# Patient Record
Sex: Female | Born: 1970 | Race: White | Hispanic: No | Marital: Single | Smoking: Former smoker
Health system: Southern US, Community
[De-identification: ages and names within clinical notes are randomized; demographics above are authoritative.]

## PROBLEM LIST (undated history)

## (undated) DIAGNOSIS — E785 Hyperlipidemia, unspecified: Secondary | ICD-10-CM

## (undated) DIAGNOSIS — F330 Major depressive disorder, recurrent, mild: Secondary | ICD-10-CM

## (undated) DIAGNOSIS — G500 Trigeminal neuralgia: Secondary | ICD-10-CM

## (undated) DIAGNOSIS — E559 Vitamin D deficiency, unspecified: Secondary | ICD-10-CM

## (undated) DIAGNOSIS — M419 Scoliosis, unspecified: Secondary | ICD-10-CM

## (undated) DIAGNOSIS — N182 Chronic kidney disease, stage 2 (mild): Secondary | ICD-10-CM

## (undated) DIAGNOSIS — J309 Allergic rhinitis, unspecified: Secondary | ICD-10-CM

## (undated) DIAGNOSIS — F909 Attention-deficit hyperactivity disorder, unspecified type: Secondary | ICD-10-CM

## (undated) HISTORY — DX: Trigeminal neuralgia: G50.0

## (undated) HISTORY — DX: Scoliosis, unspecified: M41.9

## (undated) HISTORY — DX: Attention-deficit hyperactivity disorder, unspecified type: F90.9

## (undated) HISTORY — DX: Major depressive disorder, recurrent, mild: F33.0

## (undated) HISTORY — DX: Chronic kidney disease, stage 2 (mild): N18.2

## (undated) HISTORY — DX: Allergic rhinitis, unspecified: J30.9

## (undated) HISTORY — DX: Hyperlipidemia, unspecified: E78.5

## (undated) HISTORY — DX: Vitamin D deficiency, unspecified: E55.9

---

## 2009-03-01 ENCOUNTER — Encounter: Admission: RE | Admit: 2009-03-01 | Discharge: 2009-03-01 | Payer: Self-pay | Admitting: *Deleted

## 2009-03-01 ENCOUNTER — Ambulatory Visit (HOSPITAL_COMMUNITY): Admission: RE | Admit: 2009-03-01 | Discharge: 2009-03-01 | Payer: Self-pay | Admitting: *Deleted

## 2009-03-17 ENCOUNTER — Ambulatory Visit (HOSPITAL_COMMUNITY): Admission: RE | Admit: 2009-03-17 | Discharge: 2009-03-17 | Payer: Self-pay | Admitting: *Deleted

## 2009-04-27 ENCOUNTER — Encounter: Admission: RE | Admit: 2009-04-27 | Discharge: 2009-07-26 | Payer: Self-pay | Admitting: Surgery

## 2009-05-23 ENCOUNTER — Ambulatory Visit (HOSPITAL_COMMUNITY): Admission: RE | Admit: 2009-05-23 | Discharge: 2009-05-24 | Payer: Self-pay | Admitting: Surgery

## 2009-08-21 ENCOUNTER — Encounter: Admission: RE | Admit: 2009-08-21 | Discharge: 2009-08-21 | Payer: Self-pay | Admitting: Surgery

## 2010-04-05 ENCOUNTER — Encounter: Admission: RE | Admit: 2010-04-05 | Discharge: 2010-04-05 | Payer: Self-pay | Admitting: Surgery

## 2011-04-09 LAB — DIFFERENTIAL
Basophils Absolute: 0 10*3/uL (ref 0.0–0.1)
Basophils Absolute: 0 10*3/uL (ref 0.0–0.1)
Basophils Relative: 0 % (ref 0–1)
Basophils Relative: 0 % (ref 0–1)
Eosinophils Absolute: 0.1 10*3/uL (ref 0.0–0.7)
Eosinophils Absolute: 0.1 10*3/uL (ref 0.0–0.7)
Eosinophils Relative: 2 % (ref 0–5)
Lymphs Abs: 1.4 10*3/uL (ref 0.7–4.0)
Monocytes Absolute: 0.4 10*3/uL (ref 0.1–1.0)
Monocytes Relative: 5 % (ref 3–12)
Neutrophils Relative %: 76 % (ref 43–77)

## 2011-04-09 LAB — COMPREHENSIVE METABOLIC PANEL
ALT: 76 U/L — ABNORMAL HIGH (ref 0–35)
CO2: 29 mEq/L (ref 19–32)
Calcium: 9.6 mg/dL (ref 8.4–10.5)
Creatinine, Ser: 0.82 mg/dL (ref 0.4–1.2)
GFR calc non Af Amer: >60 mL/min (ref >60)
Glucose, Bld: 101 mg/dL — ABNORMAL HIGH (ref 70–99)
Sodium: 138 mEq/L (ref 135–145)
Total Bilirubin: 0.7 mg/dL (ref 0.3–1.2)

## 2011-04-09 LAB — CBC
HCT: 39.1 % (ref 36.0–46.0)
Hemoglobin: 13.3 g/dL (ref 12.0–15.0)
Hemoglobin: 15.6 g/dL — ABNORMAL HIGH (ref 12.0–15.0)
MCHC: 33.8 g/dL (ref 30.0–36.0)
MCHC: 34.1 g/dL (ref 30.0–36.0)
MCV: 85.2 fL (ref 78.0–100.0)
MCV: 85.7 fL (ref 78.0–100.0)
RBC: 5.4 MIL/uL — ABNORMAL HIGH (ref 3.87–5.11)
RDW: 12.6 % (ref 11.5–15.5)

## 2011-04-09 LAB — GLUCOSE, CAPILLARY: Glucose-Capillary: 100 mg/dL — ABNORMAL HIGH (ref 70–99)

## 2011-04-09 LAB — HEMOGLOBIN AND HEMATOCRIT, BLOOD: Hemoglobin: 13.2 g/dL (ref 12.0–15.0)

## 2011-04-09 LAB — PREGNANCY, URINE: Preg Test, Ur: NEGATIVE

## 2011-05-14 NOTE — Op Note (Signed)
NAMEDORIANA, MAZURKIEWICZ            ACCOUNT NO.:  [id number]   MEDICAL RECORD NO.:  [id number]          PATIENT TYPE:  OIB   LOCATION:  1538                         FACILITY:  [facility]   PHYSICIAN:  Thornton Park. 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  Upper GI  showed a small hiatal hernia with no associated gastroesophageal reflux.   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  Upper GI  showed a small hiatal hernia with no associated gastroesophageal reflux.   PROCEDURE:  Laparoscopic adjustable gastric banding (Allergan APS  system) and posterior hiatal hernia repair.   SURGEON:  Thornton Park. Daphine Deutscher, M.D.   ASSISTANT:  Sharlet Salina T. Hoxworth, M.D.   DESCRIPTION OF PROCEDURE:  Ms. Aggarwal is a 40 year old lady, taken to  room 1, given general anesthesia.  The abdomen was prepped with  Technicare equivalent and draped sterilely.  Access to the abdomen was  gained through the left upper quadrant using a 0-degree OptiVu and the  abdomen was insufflated.  Standard trocar placements were used,  including a 15 in the right upper quadrant.  First, I went back and  created a spot on the left side for the band passer to come out and then  I went over on the right side and opened the right crus.  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  Therefore, I went ahead  and posteriorly put a single suture through the right and left crus,  tied that with a tie knot, and then we did the balloon test again, which  it did not allow the migration  of the balloon up into the chest.   The band passer was then passed below this along the crura and came out  easily.  The APS band tubing was threaded through the passer and was  brought around, engaged in the buckle, snapped over the tubing.  The  tubing was withdrawn.  It was then plicated with 3 sutures using  Surgidac and tie knots.  The tubing was then brought out  and the port was placed subcutaneously with mesh on the back of the  port.  This was the lateral aspect of the right lower quadrant incision.  Wounds were irrigated and closed with 4-0 Vicryl, Benzoin, and Steri-  Strips.  The patient seemed to tolerate the procedure well and was taken  to the recovery room in satisfactory condition.      Thornton Park Daphine Deutscher, MD  Electronically Signed     MBM/MEDQ  D:  05/23/2009  T:  05/24/2009  Job:  [id number]   cc:   Modesto Charon  Fax: [phone]   Reather Littler, M.D.  Fax: [phone]

## 2011-11-25 ENCOUNTER — Telehealth (INDEPENDENT_AMBULATORY_CARE_PROVIDER_SITE_OTHER): Payer: Self-pay | Admitting: Surgery

## 2011-11-25 NOTE — Telephone Encounter (Signed)
11/25/11 mailed recall to patient for bariatric surgery follow-up. Adv pt to call [facility] to schedule an appt...cef °

## 2012-02-27 ENCOUNTER — Ambulatory Visit (INDEPENDENT_AMBULATORY_CARE_PROVIDER_SITE_OTHER): Payer: BC Managed Care – PPO | Admitting: Surgery

## 2012-02-27 ENCOUNTER — Encounter (INDEPENDENT_AMBULATORY_CARE_PROVIDER_SITE_OTHER): Payer: Self-pay | Admitting: Surgery

## 2012-02-27 VITALS — BP 120/74 | HR 80 | Temp 97.6°F | Resp 18 | Ht 59.75 in | Wt 176.4 lb

## 2012-02-27 DIAGNOSIS — Z9884 Bariatric surgery status: Secondary | ICD-10-CM

## 2012-02-27 HISTORY — DX: Bariatric surgery status: Z98.84

## 2012-02-27 NOTE — Progress Notes (Signed)
Cristina Mora Body mass index is 34.74 kg/(m^2).  Having regurgitation:  Yes after flying  Nocturnal reflux?  No   Amount of fill  -0.75  I haven't seen Cristina Mora as she is seen and he. Her weight stent 176. She looks like she is doing well. She flew recently and has had some tightness. This is been accompanied with inability to tolerate certain things. I went ahead and removed three-quarter cc. She's flying next week. I was going to  see her back in 2 months.    If she is not restricted enough however probably need to see her sooner to refill her.

## 2012-05-07 ENCOUNTER — Ambulatory Visit (INDEPENDENT_AMBULATORY_CARE_PROVIDER_SITE_OTHER): Payer: BC Managed Care – PPO | Admitting: Physician Assistant

## 2012-05-07 ENCOUNTER — Encounter (INDEPENDENT_AMBULATORY_CARE_PROVIDER_SITE_OTHER): Payer: Self-pay

## 2012-05-07 VITALS — BP 120/80 | Ht 59.75 in | Wt 180.8 lb

## 2012-05-07 DIAGNOSIS — Z4651 Encounter for fitting and adjustment of gastric lap band: Secondary | ICD-10-CM

## 2012-05-07 NOTE — Progress Notes (Signed)
  HISTORY: Cristina Mora is a 41 y.o.female who received an AP-Standard lap-band in May 2010 by Dr. Daphine Deutscher. She comes in having seen Dr. Daphine Deutscher for regurgitation. She's much better now but doesn't want to return to the same fill level as even then it was a bit tight.  VITAL SIGNS: Filed Vitals:   05/07/12 1222  BP: 120/80    PHYSICAL EXAM: Physical exam reveals a very well-appearing 41 y.o.female in no apparent distress Neurologic: Awake, alert, oriented Psych: Bright affect, conversant Respiratory: Breathing even and unlabored. No stridor or wheezing Abdomen: Soft, nontender, nondistended to palpation. Incisions well-healed. No incisional hernias. Port easily palpated. Extremities: Atraumatic, good range of motion.  ASSESMENT: 41 y.o.  female  s/p AP-Standard lap-band.   PLAN: The patient's port was accessed with a 20G Huber needle without difficulty. Clear fluid was aspirated and 0.5 mL saline was added to the port. The patient was able to swallow water without difficulty following the procedure and was instructed to take clear liquids for the next 24-48 hours and advance slowly as tolerated.

## 2012-05-07 NOTE — Patient Instructions (Signed)
Take clear liquids tonight. Thin protein shakes are ok to start tomorrow morning. Slowly advance your diet thereafter. Call us if you have persistent vomiting or regurgitation, night cough or reflux symptoms. Return as scheduled or sooner if you notice no changes in hunger/portion sizes.  

## 2013-02-08 DIAGNOSIS — H5702 Anisocoria: Secondary | ICD-10-CM

## 2013-02-08 HISTORY — DX: Anisocoria: H57.02

## 2013-04-22 ENCOUNTER — Encounter (INDEPENDENT_AMBULATORY_CARE_PROVIDER_SITE_OTHER): Payer: Self-pay

## 2013-04-22 ENCOUNTER — Ambulatory Visit (INDEPENDENT_AMBULATORY_CARE_PROVIDER_SITE_OTHER): Payer: BC Managed Care – PPO | Admitting: Physician Assistant

## 2013-04-22 VITALS — BP 110/68 | HR 89 | Temp 97.7°F | Resp 18 | Ht 59.04 in | Wt 180.0 lb

## 2013-04-22 DIAGNOSIS — Z4651 Encounter for fitting and adjustment of gastric lap band: Secondary | ICD-10-CM

## 2013-04-22 NOTE — Patient Instructions (Signed)
Take clear liquids tonight. Thin protein shakes are ok to start tomorrow morning. Slowly advance your diet thereafter. Call us if you have persistent vomiting or regurgitation, night cough or reflux symptoms. Return as scheduled or sooner if you notice no changes in hunger/portion sizes.  

## 2013-04-22 NOTE — Progress Notes (Signed)
  HISTORY: Cristina Mora is a 42 y.o.female who received an AP-Standard lap-band in May 2010 by Dr. Daphine Deutscher. She comes in with stable weight since her last visit. She has been diagnosed hypothyroid and is now taking 50 mcg levothyroxine daily. She denies regurgitation or reflux symptoms but she does complain of poor satiety. She wants a fill to keep on track.  VITAL SIGNS: Filed Vitals:   04/22/13 1014  BP: 110/68  Pulse: 89  Temp: 97.7 F (36.5 C)  Resp: 18    PHYSICAL EXAM: Physical exam reveals a very well-appearing 42 y.o.female in no apparent distress Neurologic: Awake, alert, oriented Psych: Bright affect, conversant Respiratory: Breathing even and unlabored. No stridor or wheezing Abdomen: Soft, nontender, nondistended to palpation. Incisions well-healed. No incisional hernias. Port easily palpated. Extremities: Atraumatic, good range of motion.  ASSESMENT: 42 y.o.  female  s/p AP-Standard lap-band.   PLAN: The patient's port was accessed with a 20G Huber needle without difficulty. Clear fluid was aspirated and 0.5 mL saline was added to the port to give a total predicted volume of 5.75 mL. The patient was able to swallow water without difficulty following the procedure and was instructed to take clear liquids for the next 24-48 hours and advance slowly as tolerated.

## 2014-03-17 ENCOUNTER — Ambulatory Visit (INDEPENDENT_AMBULATORY_CARE_PROVIDER_SITE_OTHER): Payer: BC Managed Care – PPO | Admitting: Physician Assistant

## 2014-03-17 ENCOUNTER — Encounter (INDEPENDENT_AMBULATORY_CARE_PROVIDER_SITE_OTHER): Payer: Self-pay

## 2014-03-17 VITALS — BP 124/68 | HR 72 | Temp 98.3°F | Resp 14 | Ht 59.75 in | Wt 184.6 lb

## 2014-03-17 DIAGNOSIS — Z4651 Encounter for fitting and adjustment of gastric lap band: Secondary | ICD-10-CM

## 2014-03-17 NOTE — Progress Notes (Signed)
  HISTORY: Cristina Mora is a 43 y.o.female who received an AP-Standard lap-band in May 2010 by Dr. Daphine DeutscherMartin. She comes in with 5 lbs weight gain since her last visit in April of last year. She reports exercising for around 30 minutes twice a day. She's had no changes in her diet overall, with no increase in portion sizes or calories. She has occasional regurgitation, but she attributes this to inadequate chewing. She's disappointed that she's not losing weight despite what she sees as good, healthful eating and exercise. She is scheduled to see a nutritionist soon. She would like a fill to see if this gets some more weight off.  VITAL SIGNS: Filed Vitals:   03/17/14 1547  BP: 124/68  Pulse: 72  Temp: 98.3 F (36.8 C)  Resp: 14    PHYSICAL EXAM: Physical exam reveals a very well-appearing 43 y.o.female in no apparent distress Neurologic: Awake, alert, oriented Psych: Bright affect, conversant Respiratory: Breathing even and unlabored. No stridor or wheezing Abdomen: Soft, nontender, nondistended to palpation. Incisions well-healed. No incisional hernias. Port easily palpated. Extremities: Atraumatic, good range of motion.  ASSESMENT: 43 y.o.  female  s/p AP-Standard lap-band.   PLAN: The patient's port was accessed with a 20G Huber needle without difficulty. Clear fluid was aspirated and 0.25 mL saline was added to the port to give a total predicted volume of 6 mL. The patient was able to swallow water without difficulty following the procedure and was instructed to take clear liquids for the next 24-48 hours and advance slowly as tolerated.

## 2014-03-17 NOTE — Patient Instructions (Signed)

## 2014-06-16 ENCOUNTER — Encounter (INDEPENDENT_AMBULATORY_CARE_PROVIDER_SITE_OTHER): Payer: BC Managed Care – PPO

## 2014-06-16 ENCOUNTER — Telehealth (INDEPENDENT_AMBULATORY_CARE_PROVIDER_SITE_OTHER): Payer: Self-pay | Admitting: *Deleted

## 2014-06-16 NOTE — Telephone Encounter (Signed)
Lm for pt to return my call.  Asked her to ask for me, i was wanting to see if she could come in earlier. Cristina DikeJennifer

## 2016-10-04 ENCOUNTER — Encounter (HOSPITAL_COMMUNITY): Payer: Self-pay

## 2017-10-22 DIAGNOSIS — Z9189 Other specified personal risk factors, not elsewhere classified: Secondary | ICD-10-CM

## 2017-10-22 DIAGNOSIS — W57XXXA Bitten or stung by nonvenomous insect and other nonvenomous arthropods, initial encounter: Secondary | ICD-10-CM | POA: Insufficient documentation

## 2017-10-22 DIAGNOSIS — R768 Other specified abnormal immunological findings in serum: Secondary | ICD-10-CM | POA: Insufficient documentation

## 2017-10-22 HISTORY — DX: Other specified abnormal immunological findings in serum: R76.8

## 2017-10-22 HISTORY — DX: Bitten or stung by nonvenomous insect and other nonvenomous arthropods, initial encounter: W57.XXXA

## 2017-10-22 HISTORY — DX: Other specified personal risk factors, not elsewhere classified: Z91.89

## 2017-12-16 ENCOUNTER — Encounter (HOSPITAL_COMMUNITY): Payer: Self-pay

## 2017-12-25 ENCOUNTER — Telehealth (HOSPITAL_COMMUNITY): Payer: Self-pay

## 2017-12-25 NOTE — Telephone Encounter (Signed)
Returned mail; marked return to sender/fwd time has expired: This patient is overdue for recommended follow-up [facility]. A letter was mailed to the address on file 12.18.18  from both Cristina Mora & [facility] in attempt to reestablish post-op care. Letter has been returned to Cristina Mora this week marked return to sender, forward time to new address has expired. Letter was sent out again today to the new address provided on the USPS RTS sticker: [address] in [place], [phone]

## 2018-12-17 ENCOUNTER — Encounter (HOSPITAL_COMMUNITY): Payer: Self-pay

## 2020-05-19 DIAGNOSIS — E6609 Other obesity due to excess calories: Secondary | ICD-10-CM

## 2020-05-19 DIAGNOSIS — Z6837 Body mass index (BMI) 37.0-37.9, adult: Secondary | ICD-10-CM | POA: Insufficient documentation

## 2020-05-19 HISTORY — DX: Other obesity due to excess calories: E66.09

## 2020-09-28 ENCOUNTER — Other Ambulatory Visit: Payer: Self-pay | Admitting: Family Medicine

## 2020-09-28 DIAGNOSIS — G379 Demyelinating disease of central nervous system, unspecified: Secondary | ICD-10-CM

## 2020-10-27 ENCOUNTER — Ambulatory Visit
Admission: RE | Admit: 2020-10-27 | Discharge: 2020-10-27 | Disposition: A | Discharge status: Home / Self Care | Payer: BC Managed Care – PPO | Source: Ambulatory Visit | Attending: Family Medicine | Admitting: Family Medicine

## 2020-10-27 ENCOUNTER — Other Ambulatory Visit: Payer: Self-pay

## 2020-10-27 DIAGNOSIS — G379 Demyelinating disease of central nervous system, unspecified: Secondary | ICD-10-CM

## 2020-11-06 ENCOUNTER — Other Ambulatory Visit: Payer: Self-pay

## 2020-11-06 DIAGNOSIS — E230 Hypopituitarism: Secondary | ICD-10-CM

## 2020-11-06 DIAGNOSIS — E039 Hypothyroidism, unspecified: Secondary | ICD-10-CM

## 2020-11-06 HISTORY — DX: Hypothyroidism, unspecified: E03.9

## 2020-11-06 HISTORY — DX: Hypopituitarism: E23.0

## 2020-11-08 ENCOUNTER — Other Ambulatory Visit: Payer: Self-pay

## 2020-11-08 ENCOUNTER — Encounter: Payer: Self-pay | Admitting: Cardiology

## 2020-11-08 ENCOUNTER — Ambulatory Visit (INDEPENDENT_AMBULATORY_CARE_PROVIDER_SITE_OTHER): Payer: BC Managed Care – PPO

## 2020-11-08 ENCOUNTER — Ambulatory Visit (INDEPENDENT_AMBULATORY_CARE_PROVIDER_SITE_OTHER): Payer: BC Managed Care – PPO | Admitting: Cardiology

## 2020-11-08 VITALS — BP 116/62 | HR 77 | Ht 59.0 in | Wt 184.4 lb

## 2020-11-08 DIAGNOSIS — I491 Atrial premature depolarization: Secondary | ICD-10-CM

## 2020-11-08 DIAGNOSIS — R002 Palpitations: Secondary | ICD-10-CM

## 2020-11-08 DIAGNOSIS — R011 Cardiac murmur, unspecified: Secondary | ICD-10-CM

## 2020-11-08 NOTE — Patient Instructions (Signed)
Medication Instructions:  No medication changes. *If you need a refill on your cardiac medications before your next appointment, please call your pharmacy*   Lab Work: You had a Lpa done in the office today. If you have labs (blood work) drawn today and your tests are completely normal, you will receive your results only by: Marland Kitchen MyChart Message (if you have MyChart) OR . A paper copy in the mail If you have any lab test that is abnormal or we need to change your treatment, we will call you to review the results.   Testing/Procedures:  WHY IS MY DOCTOR PRESCRIBING ZIO? The Zio system is proven and trusted by physicians to detect and diagnose irregular heart rhythms -- and has been prescribed to hundreds of thousands of patients.  The FDA has cleared the Zio system to monitor for many different kinds of irregular heart rhythms. In a study, physicians were able to reach a diagnosis 90% of the time with the Zio system1.  You can wear the Zio monitor -- a small, discreet, comfortable patch -- during your normal day-to-day activity, including while you sleep, shower, and exercise, while it records every single heartbeat for analysis.  1Barrett, P., et al. Comparison of 24 Hour Holter Monitoring Versus 14 Day Novel Adhesive Patch Electrocardiographic Monitoring. American Journal of Medicine, 2014.  ZIO VS. HOLTER MONITORING The Zio monitor can be comfortably worn for up to 14 days. Holter monitors can be worn for 24 to 48 hours, limiting the time to record any irregular heart rhythms you may have. Zio is able to capture data for the 51% of patients who have their first symptom-triggered arrhythmia after 48 hours.1  LIVE WITHOUT RESTRICTIONS The Zio ambulatory cardiac monitor is a small, unobtrusive, and water-resistant patch--you might even forget you're wearing it. The Zio monitor records and stores every beat of your heart, whether you're sleeping, working out, or showering.  Wear the monitor  for 2 weeks, remove 11/22/20.  Follow-Up: At [facility], you and your health needs are our priority.  As part of our continuing mission to provide you with exceptional heart care, we have created designated Provider Care Teams.  These Care Teams include your primary Cardiologist (physician) and Advanced Practice Providers (APPs -  Physician Assistants and Nurse Practitioners) who all work together to provide you with the care you need, when you need it.  We recommend signing up for the patient portal called "MyChart".  Sign up information is provided on this After Visit Summary.  MyChart is used to connect with patients for Virtual Visits (Telemedicine).  Patients are able to view lab/test results, encounter notes, upcoming appointments, etc.  Non-urgent messages can be sent to your provider as well.   To learn more about what you can do with MyChart, go to [email or web address].    Your next appointment:   8 week(s)  The format for your next appointment:   In Person  Provider:   Belva Crome, MD   Other Instructions  Echocardiogram An echocardiogram is a procedure that uses painless sound waves (ultrasound) to produce an image of the heart. Images from an echocardiogram can provide important information about:  Signs of coronary artery disease (CAD).  Aneurysm detection. An aneurysm is a weak or damaged part of an artery wall that bulges out from the normal force of blood pumping through the body.  Heart size and shape. Changes in the size or shape of the heart can be associated with certain conditions, including heart failure, aneurysm,  and CAD.  Heart muscle function.  Heart valve function.  Signs of a past heart attack.  Fluid buildup around the heart.  Thickening of the heart muscle.  A tumor or infectious growth around the heart valves. Tell a health care provider about:  Any allergies you have.  All medicines you are taking, including vitamins, herbs, eye  drops, creams, and over-the-counter medicines.  Any blood disorders you have.  Any surgeries you have had.  Any medical conditions you have.  Whether you are pregnant or may be pregnant. What are the risks? Generally, this is a safe procedure. However, problems may occur, including:  Allergic reaction to dye (contrast) that may be used during the procedure. What happens before the procedure? No specific preparation is needed. You may eat and drink normally. What happens during the procedure?   An IV tube may be inserted into one of your veins.  You may receive contrast through this tube. A contrast is an injection that improves the quality of the pictures from your heart.  A gel will be applied to your chest.  A wand-like tool (transducer) will be moved over your chest. The gel will help to transmit the sound waves from the transducer.  The sound waves will harmlessly bounce off of your heart to allow the heart images to be captured in real-time motion. The images will be recorded on a computer. The procedure may vary among health care providers and hospitals. What happens after the procedure?  You may return to your normal, everyday life, including diet, activities, and medicines, unless your health care provider tells you not to do that. Summary  An echocardiogram is a procedure that uses painless sound waves (ultrasound) to produce an image of the heart.  Images from an echocardiogram can provide important information about the size and shape of your heart, heart muscle function, heart valve function, and fluid buildup around your heart.  You do not need to do anything to prepare before this procedure. You may eat and drink normally.  After the echocardiogram is completed, you may return to your normal, everyday life, unless your health care provider tells you not to do that. This information is not intended to replace advice given to you by your health care provider. Make  sure you discuss any questions you have with your health care provider. Document Revised: 04/08/2019 Document Reviewed: 01/18/2017 Elsevier Patient Education  2020 [employer].

## 2020-11-08 NOTE — Progress Notes (Signed)
Cardiology Office Note:    Date:  11/08/2020   ID:  Cristina CoffeeHalley D Dingee, DOB 10/25/1971, MRN [id number]  PCP:  Konrad Felixhomas, Brad, MD (Inactive)  Cardiologist:  No primary care provider on file.  Electrophysiologist:  None   Referring MD: Marlyn CorporalYates, Kate H, PA   I have been having some fast heartbeats. History of Present Illness:    Cristina Mora is a 49 y.o. female with a hx of hypothyroidism, metabolic syndrome obesity presents today to be evaluated due to significant fast heartbeat.  She tells me have been going on for a while now.  Recently she feels the episodes are frequent.  She does feel lightheaded when this happens.  She get dizzy but it feels abrupt runs for a little bit and then resolved.  She denies any chest pain and had not ever passed out.  She is concerned about this.  Past Medical History:  Diagnosis Date  . 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 At high risk for tick borne illness 10/22/2017  . Class 2 obesity due to excess calories without serious comorbidity with body mass index (BMI) of 36.0 to 36.9 in adult 05/19/2020  . 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 Hypothyroid 11/06/2020  . 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 Tick bite 10/22/2017    History reviewed. No pertinent surgical history.  Current Medications: Current Meds  Medication Sig  . cetirizine (ZYRTEC) 10 MG tablet Take 10 mg by mouth.  . diazepam (VALIUM) 2 MG tablet Take 2 mg by mouth 2 (two) times daily.  . ergocalciferol (VITAMIN D2) 1.25 MG (50000 UT) capsule Take 1 capsule by mouth once a week.  . Insulin Pen Needle (BD PEN NEEDLE NANO U/F) 32G X 4 MM MISC Inject once daily  . levonorgestrel (MIRENA) 20 MCG/24HR IUD by Intrauterine route.  Marland Kitchen. levothyroxine (SYNTHROID, LEVOTHROID) 50 MCG tablet Take 50 mcg by mouth daily before breakfast.  . NP THYROID 30 MG tablet Take by mouth.  . venlafaxine XR (EFFEXOR-XR) 37.5 MG 24 hr capsule   . [DISCONTINUED] OZEMPIC, 0.25 OR  0.5 MG/DOSE, 2 MG/1.5ML SOPN Inject into the skin. Inject once weekly     Allergies:   Ampicillin, Other, and Penicillins   Social History   Socioeconomic History  . Marital status: Single    Spouse name: Not on file  . Number of children: Not on file  . Years of education: Not on file  . Highest education level: Not on file  Occupational History  . Not on file  Tobacco Use  . Smoking status: Former Smoker    Quit date: 03/17/2006    Years since quitting: 14.6  . Smokeless tobacco: Never Used  Substance and Sexual Activity  . Alcohol use: Yes  . Drug use: No  . Sexual activity: Not on file  Other Topics Concern  . Not on file  Social History Narrative  . Not on file   Social Determinants of Health   Financial Resource Strain:   . Difficulty of Paying Living Expenses: Not on file  Food Insecurity:   . Worried About Programme researcher, broadcasting/film/videounning Out of Food in the Last Year: Not on file  . Ran Out of Food in the Last Year: Not on file  Transportation Needs:   . Lack of Transportation (Medical): Not on file  . Lack of Transportation (Non-Medical): Not on file  Physical Activity:   . Days of Exercise per Week: Not on file  . Minutes of  Exercise per Session: Not on file  Stress:   . Feeling of Stress : Not on file  Social Connections:   . Frequency of Communication with Friends and Family: Not on file  . Frequency of Social Gatherings with Friends and Family: Not on file  . Attends Religious Services: Not on file  . Active Member of Clubs or Organizations: Not on file  . Attends Banker Meetings: Not on file  . Marital Status: Not on file     Family History: The patient's family history is not on file.  ROS:   Review of Systems  Constitution: Negative for decreased appetite, fever and weight gain.  HENT: Negative for congestion, ear discharge, hoarse voice and sore throat.   Eyes: Negative for discharge, redness, vision loss in right eye and visual halos.    Cardiovascular: Reports palpitation.  Negative for for chest pain, dyspnea on exertion, leg swelling, orthopnea............................Marland Kitchen Respiratory: Negative for cough, hemoptysis, shortness of breath and snoring.   Endocrine: Negative for heat intolerance and polyphagia.  Hematologic/Lymphatic: Negative for bleeding problem. Does not bruise/bleed easily.  Skin: Negative for flushing, nail changes, rash and suspicious lesions.  Musculoskeletal: Negative for arthritis, joint pain, muscle cramps, myalgias, neck pain and stiffness.  Gastrointestinal: Negative for abdominal pain, bowel incontinence, diarrhea and excessive appetite.  Genitourinary: Negative for decreased libido, genital sores and incomplete emptying.  Neurological: Negative for brief paralysis, focal weakness, headaches and loss of balance.  Psychiatric/Behavioral: Negative for altered mental status, depression and suicidal ideas.  Allergic/Immunologic: Negative for HIV exposure and persistent infections.    EKGs/Labs/Other Studies Reviewed:    The following studies were reviewed today:   EKG:  The ekg ordered today demonstrates sinus rhythm, heart rate 77 bpm with occasional PACs.  Diffuse low voltage.   Recent Labs: No results found for requested labs within last 8760 hours.  Recent Lipid Panel No results found for: CHOL, TRIG, HDL, CHOLHDL, VLDL, LDLCALC, LDLDIRECT  Physical Exam:    VS:  BP 116/62 (BP Location: Right Arm)   Pulse 77   Ht  (1.499 m)   Wt 184 lb 6.4 oz (83.6 kg)   SpO2 97%   BMI 37.24 kg/m     Wt Readings from Last 3 Encounters:  11/08/20 184 lb 6.4 oz (83.6 kg)  03/17/14 184 lb 9.6 oz (83.7 kg)  04/22/13 180 lb (81.6 kg)     GEN: Well nourished, well developed in no acute distress HEENT: Normal NECK: No JVD; No carotid bruits LYMPHATICS: No lymphadenopathy CARDIAC: S1S2 noted,RRR, 26 soft systolic ejection murmurs, rubs, gallops RESPIRATORY:  Clear to auscultation without  rales, wheezing or rhonchi  ABDOMEN: Soft, non-tender, non-distended, +bowel sounds, no guarding. EXTREMITIES: No edema, No cyanosis, no clubbing MUSCULOSKELETAL:  No deformity  SKIN: Warm and dry NEUROLOGIC:  Alert and oriented x 3, non-focal PSYCHIATRIC:  Normal affect, good insight  ASSESSMENT:    1. PAC (premature atrial contraction)   2. Murmur, cardiac   3. Palpitations    PLAN:     1.  I would like to rule out a cardiovascular etiology of this palpitation, therefore at this time I would like to placed a zio patch for 14  days.  I have also asked the patient to keep a diary and help [place] understand if there is any specific thing in her diet that could be making her symptoms worse.  Her clinical exam show evidence of systolic murmur.  Would like to get an echocardiogram to rule  out any structural abnormalities.  The patient understands the need to lose weight with diet and exercise. We have discussed specific strategies for this.  The patient is in agreement with the above plan. The patient left the office in stable condition.  The patient will follow up in   Medication Adjustments/Labs and Tests Ordered: Current medicines are reviewed at length with the patient today.  Concerns regarding medicines are outlined above.  Orders Placed This Encounter  Procedures  . Lipoprotein A (LPA)  . LONG TERM MONITOR (3-14 DAYS)  . EKG 12-Lead  . ECHOCARDIOGRAM COMPLETE   No orders of the defined types were placed in this encounter.   Patient Instructions  Medication Instructions:  No medication changes. *If you need a refill on your cardiac medications before your next appointment, please call your pharmacy*   Lab Work: You had a Lpa done in the office today. If you have labs (blood work) drawn today and your tests are completely normal, you will receive your results only by: Marland Kitchen MyChart Message (if you have MyChart) OR . A paper copy in the mail If you have any lab test that is  abnormal or we need to change your treatment, we will call you to review the results.   Testing/Procedures:  WHY IS MY DOCTOR PRESCRIBING ZIO? The Zio system is proven and trusted by physicians to detect and diagnose irregular heart rhythms -- and has been prescribed to hundreds of thousands of patients.  The FDA has cleared the Zio system to monitor for many different kinds of irregular heart rhythms. In a study, physicians were able to reach a diagnosis 90% of the time with the Zio system1.  You can wear the Zio monitor -- a small, discreet, comfortable patch -- during your normal day-to-day activity, including while you sleep, shower, and exercise, while it records every single heartbeat for analysis.  1Barrett, P., et al. Comparison of 24 Hour Holter Monitoring Versus 14 Day Novel Adhesive Patch Electrocardiographic Monitoring. American Journal of Medicine, 2014.  ZIO VS. HOLTER MONITORING The Zio monitor can be comfortably worn for up to 14 days. Holter monitors can be worn for 24 to 48 hours, limiting the time to record any irregular heart rhythms you may have. Zio is able to capture data for the 51% of patients who have their first symptom-triggered arrhythmia after 48 hours.1  LIVE WITHOUT RESTRICTIONS The Zio ambulatory cardiac monitor is a small, unobtrusive, and water-resistant patch--you might even forget you're wearing it. The Zio monitor records and stores every beat of your heart, whether you're sleeping, working out, or showering.  Wear the monitor for 2 weeks, remove 11/22/20.  Follow-Up: At [facility], you and your health needs are our priority.  As part of our continuing mission to provide you with exceptional heart care, we have created designated Provider Care Teams.  These Care Teams include your primary Cardiologist (physician) and Advanced Practice Providers (APPs -  Physician Assistants and Nurse Practitioners) who all work together to provide you with the care you  need, when you need it.  We recommend signing up for the patient portal called "MyChart".  Sign up information is provided on this After Visit Summary.  MyChart is used to connect with patients for Virtual Visits (Telemedicine).  Patients are able to view lab/test results, encounter notes, upcoming appointments, etc.  Non-urgent messages can be sent to your provider as well.   To learn more about what you can do with MyChart, go to [email or web address].  Your next appointment:   8 week(s)  The format for your next appointment:   In Person  Provider:   Belva Crome, MD   Other Instructions  Echocardiogram An echocardiogram is a procedure that uses painless sound waves (ultrasound) to produce an image of the heart. Images from an echocardiogram can provide important information about:  Signs of coronary artery disease (CAD).  Aneurysm detection. An aneurysm is a weak or damaged part of an artery wall that bulges out from the normal force of blood pumping through the body.  Heart size and shape. Changes in the size or shape of the heart can be associated with certain conditions, including heart failure, aneurysm, and CAD.  Heart muscle function.  Heart valve function.  Signs of a past heart attack.  Fluid buildup around the heart.  Thickening of the heart muscle.  A tumor or infectious growth around the heart valves. Tell a health care provider about:  Any allergies you have.  All medicines you are taking, including vitamins, herbs, eye drops, creams, and over-the-counter medicines.  Any blood disorders you have.  Any surgeries you have had.  Any medical conditions you have.  Whether you are pregnant or may be pregnant. What are the risks? Generally, this is a safe procedure. However, problems may occur, including:  Allergic reaction to dye (contrast) that may be used during the procedure. What happens before the procedure? No specific preparation is needed.  You may eat and drink normally. What happens during the procedure?   An IV tube may be inserted into one of your veins.  You may receive contrast through this tube. A contrast is an injection that improves the quality of the pictures from your heart.  A gel will be applied to your chest.  A wand-like tool (transducer) will be moved over your chest. The gel will help to transmit the sound waves from the transducer.  The sound waves will harmlessly bounce off of your heart to allow the heart images to be captured in real-time motion. The images will be recorded on a computer. The procedure may vary among health care providers and hospitals. What happens after the procedure?  You may return to your normal, everyday life, including diet, activities, and medicines, unless your health care provider tells you not to do that. Summary  An echocardiogram is a procedure that uses painless sound waves (ultrasound) to produce an image of the heart.  Images from an echocardiogram can provide important information about the size and shape of your heart, heart muscle function, heart valve function, and fluid buildup around your heart.  You do not need to do anything to prepare before this procedure. You may eat and drink normally.  After the echocardiogram is completed, you may return to your normal, everyday life, unless your health care provider tells you not to do that. This information is not intended to replace advice given to you by your health care provider. Make sure you discuss any questions you have with your health care provider. Document Revised: 04/08/2019 Document Reviewed: 01/18/2017 Elsevier Patient Education  2020 [employer].      Adopting a Healthy Lifestyle.  Know what a healthy weight is for you (roughly BMI <25) and aim to maintain this   Aim for 7+ servings of fruits and vegetables daily   65-80+ fluid ounces of water or unsweet tea for healthy kidneys   Limit to max  1 drink of alcohol per day; avoid smoking/tobacco   Limit animal  fats in diet for cholesterol and heart health - choose grass fed whenever available   Avoid highly processed foods, and foods high in saturated/trans fats   Aim for low stress - take time to unwind and care for your mental health   Aim for 150 min of moderate intensity exercise weekly for heart health, and weights twice weekly for bone health   Aim for 7-9 hours of sleep daily   When it comes to diets, agreement about the perfect plan isnt easy to find, even among the experts. Experts at the [facility] [employer] developed an idea known as the Healthy Eating Plate. Just imagine a plate divided into logical, healthy portions.   The emphasis is on diet quality:   Load up on vegetables and fruits - one-half of your plate: Aim for color and variety, and remember that potatoes dont count.   Go for whole grains - one-quarter of your plate: Whole wheat, barley, wheat berries, quinoa, oats, brown rice, and foods made with them. If you want pasta, go with whole wheat pasta.   Protein power - one-quarter of your plate: Fish, chicken, beans, and nuts are all healthy, versatile protein sources. Limit red meat.   The diet, however, does go beyond the plate, offering a few other suggestions.   Use healthy plant oils, such as olive, canola, soy, corn, sunflower and peanut. Check the labels, and avoid partially hydrogenated oil, which have unhealthy trans fats.   If youre thirsty, drink water. Coffee and tea are good in moderation, but skip sugary drinks and limit milk and dairy products to one or two daily servings.   The type of carbohydrate in the diet is more important than the amount. Some sources of carbohydrates, such as vegetables, fruits, whole grains, and beans-are healthier than others.   Finally, stay active  Signed, Thomasene Ripple, DO  11/08/2020 4:38 PM    [facility]

## 2020-11-09 ENCOUNTER — Telehealth: Payer: Self-pay

## 2020-11-09 LAB — LIPOPROTEIN A (LPA): Lipoprotein (a): 21.8 nmol/L (ref <75.0)

## 2020-11-09 NOTE — Telephone Encounter (Signed)
-----   Message from Cristina Ripple, DO sent at 11/09/2020  1:45 PM EST ----- The labs today yesterday, LP(a) was normal

## 2020-11-09 NOTE — Telephone Encounter (Signed)
Left message on patients voicemail to please return our call.   

## 2020-11-10 ENCOUNTER — Telehealth: Payer: Self-pay

## 2020-11-10 NOTE — Telephone Encounter (Signed)
Spoke with patient regarding results and recommendation.  Patient verbalizes understanding and is agreeable to plan of care. Advised patient to call back with any issues or concerns.  

## 2020-11-10 NOTE — Telephone Encounter (Signed)
-----   Message from Kardie Tobb, DO sent at 11/09/2020  1:45 PM EST ----- °The labs today yesterday, LP(a) was normal °

## 2020-11-16 ENCOUNTER — Other Ambulatory Visit: Payer: Self-pay

## 2020-11-16 ENCOUNTER — Ambulatory Visit (INDEPENDENT_AMBULATORY_CARE_PROVIDER_SITE_OTHER): Payer: BC Managed Care – PPO

## 2020-11-16 DIAGNOSIS — R011 Cardiac murmur, unspecified: Secondary | ICD-10-CM

## 2020-11-16 DIAGNOSIS — R002 Palpitations: Secondary | ICD-10-CM

## 2020-11-16 LAB — ECHOCARDIOGRAM COMPLETE
Area-P 1/2: 2.76 cm2
S' Lateral: 2.8 cm

## 2020-11-16 NOTE — Progress Notes (Signed)
Complete echocardiogram has been performed.  Jimmy Basim Bartnik RDCS, RVT 

## 2020-11-17 ENCOUNTER — Telehealth: Payer: Self-pay | Admitting: Cardiology

## 2020-11-17 ENCOUNTER — Telehealth: Payer: Self-pay

## 2020-11-17 NOTE — Telephone Encounter (Signed)
Patient is calling to get her results °

## 2020-11-17 NOTE — Telephone Encounter (Signed)
Spoke with patient regarding results and recommendation.  Patient verbalizes understanding and is agreeable to plan of care. Advised patient to call back with any issues or concerns.  

## 2020-11-17 NOTE — Telephone Encounter (Signed)
-----   Message from Baldo Daub, MD sent at 11/17/2020  7:58 AM EST ----- This is a good result his echocardiogram is normal for age

## 2020-11-17 NOTE — Telephone Encounter (Signed)
Left message on patients voicemail to please return our call.   

## 2020-12-04 ENCOUNTER — Telehealth: Payer: Self-pay

## 2020-12-04 NOTE — Telephone Encounter (Signed)
Left message on patients voicemail to please return our call.   

## 2020-12-04 NOTE — Telephone Encounter (Signed)
-----   Message from Kardie Tobb, DO sent at 12/03/2020 12:07 PM EST ----- Please have the patient to see me before the holiday so we can discuss her monitor results. 

## 2020-12-05 ENCOUNTER — Telehealth: Payer: Self-pay

## 2020-12-05 NOTE — Telephone Encounter (Signed)
-----   Message from Cristina Ripple, DO sent at 12/03/2020 12:07 PM EST ----- Please have the patient to see me before the holiday so we can discuss her monitor results.

## 2020-12-05 NOTE — Telephone Encounter (Signed)
Spoke with patient regarding results and recommendation.  Patient verbalizes understanding and is agreeable to plan of care. Advised patient to call back with any issues or concerns.   Patient scheduled for 12/12/20 with Dr. Servando Salina

## 2020-12-06 ENCOUNTER — Other Ambulatory Visit: Payer: Self-pay

## 2020-12-12 ENCOUNTER — Other Ambulatory Visit: Payer: Self-pay

## 2020-12-12 ENCOUNTER — Encounter: Payer: Self-pay | Admitting: Cardiology

## 2020-12-12 ENCOUNTER — Ambulatory Visit: Payer: BC Managed Care – PPO | Admitting: Cardiology

## 2020-12-12 VITALS — BP 100/78 | HR 74 | Ht 59.0 in | Wt 189.8 lb

## 2020-12-12 DIAGNOSIS — I491 Atrial premature depolarization: Secondary | ICD-10-CM

## 2020-12-12 DIAGNOSIS — I471 Supraventricular tachycardia: Secondary | ICD-10-CM

## 2020-12-12 DIAGNOSIS — I4719 Other supraventricular tachycardia: Secondary | ICD-10-CM

## 2020-12-12 DIAGNOSIS — E669 Obesity, unspecified: Secondary | ICD-10-CM

## 2020-12-12 DIAGNOSIS — E8881 Metabolic syndrome: Secondary | ICD-10-CM | POA: Diagnosis not present

## 2020-12-12 HISTORY — DX: Supraventricular tachycardia: I47.1

## 2020-12-12 HISTORY — DX: Atrial premature depolarization: I49.1

## 2020-12-12 HISTORY — DX: Other supraventricular tachycardia: I47.19

## 2020-12-12 MED ORDER — PROPRANOLOL HCL 20 MG PO TABS: 20.0000 mg | ORAL_TABLET | Freq: Two times a day (BID) | ORAL | 2 refills | Status: DC

## 2020-12-12 NOTE — Progress Notes (Signed)
Cardiology Office Note:    Date:  12/12/2020   ID:  Meda Coffee, DOB 1971-02-16, MRN [id number]  PCP:  Paulina Fusi, MD  Cardiologist:  Thomasene Ripple, DO  Electrophysiologist:  None   Referring MD: Paulina Fusi, MD   " I am still having some palpitations"  History of Present Illness:    Cristina Mora is a 49 y.o. female with a hx of hypothyroidism, metabolic syndrome, obesity, comes today for follow-up visit.  Did see the patient on November 08, 2020 at that time she reported she has had some lightheadedness and dizziness with some episodes of palpitations.  During her visit systolic murmurs were heard I therefore placed a monitor and the patient as well as ZIO monitor to understand her PACs as well as palpitations. She is here today to discuss results.  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 No pertinent surgical history.  Current Medications: Current Meds  Medication Sig   Semaglutide,0.25 or 0.5MG /DOS, 2 MG/1.5ML SOPN Inject into the skin.     Allergies:   Ampicillin, Other, and Penicillins   Social History   Socioeconomic History   Marital status: Single    Spouse name: Not on file   Number of children: Not on file   Years of education: Not on file   Highest education level: Not on file  Occupational History   Not on file  Tobacco Use   Smoking status: Former Smoker    Quit date: 03/17/2006    Years since quitting: 14.7   Smokeless tobacco: Never Used  Substance and Sexual Activity   Alcohol use: Yes   Drug use: No   Sexual activity: Not  on file  Other Topics Concern   Not on file  Social History Narrative   Not on file   Social Determinants of Health   Financial Resource Strain: Not on file  Food Insecurity: Not on file  Transportation Needs: Not on file  Physical Activity: Not on file  Stress: Not on file  Social Connections: Not on file     Family History: The patient's family history is not on file.  ROS:   Review of Systems  Constitution: Negative for decreased appetite, fever and weight gain.  HENT: Negative for congestion, ear discharge, hoarse voice and sore throat.   Eyes: Negative for discharge, redness, vision loss in right eye and visual halos.  Cardiovascular: Negative for chest pain, dyspnea on exertion, leg swelling, orthopnea and palpitations.  Respiratory: Negative for cough, hemoptysis, shortness of breath and snoring.   Endocrine: Negative for heat intolerance and polyphagia.  Hematologic/Lymphatic: Negative for bleeding problem. Does not bruise/bleed easily.  Skin: Negative for flushing, nail changes, rash and suspicious lesions.  Musculoskeletal: Negative for arthritis, joint pain, muscle cramps, myalgias, neck pain and stiffness.  Gastrointestinal: Negative for abdominal pain, bowel incontinence, diarrhea and excessive appetite.  Genitourinary: Negative for decreased libido, genital sores and incomplete emptying.  Neurological: Negative for brief paralysis, focal weakness, headaches and loss of balance.  Psychiatric/Behavioral: Negative for altered mental status, depression and suicidal ideas.  Allergic/Immunologic: Negative for HIV exposure and persistent infections.    EKGs/Labs/Other Studies Reviewed:    The following studies were reviewed today:   EKG: None today  Transthoracic echocardiogram IMPRESSIONS  1. Left ventricular ejection fraction, by estimation, is 60 to 65%. The  left ventricle has normal function. The left ventricle has no regional  wall motion abnormalities.  Left ventricular diastolic parameters were  normal.  2. Right ventricular systolic function is normal. The right ventricular  size is normal. There is normal pulmonary artery systolic pressure.  3. The mitral valve is normal in structure. No evidence of mitral valve  regurgitation. No evidence of mitral stenosis.  4. The aortic valve is normal in structure. Aortic valve regurgitation is  not visualized. No aortic stenosis is present.  5. The inferior vena cava is normal in size with greater than 50%  respiratory variability, suggesting right atrial pressure of 3 mmHg.   ZIO monitor The patient wore the monitor for 13 days, 13 hours starting November 08, 2020. Indication: Palpitations  The minimum heart rate was 49 bpm, maximum heart rate was 272 bpm, and average heart rate was 89 bpm. Predominant underlying rhythm was Sinus Rhythm.  5 Supraventricular Tachycardia runs occurred, the run with the fastest interval lasting 19.1 secs with a maximum heart rate of 272 bpm (average 259 bpm); the run with the fastest interval was also the longest.  Premature atrial complexes were occasional (1.4%, 23146). Premature Ventricular complexes were rare less than 1%.  No pauses, No AV block and no atrial fibrillation present. 4  patient triggered events: all 4 associated with supraventricular tachycardia4  6 Diary events: 1 associated with supraventricular tachycardia, remaining associated with premature atrial complex and premature ventricular complex.  Conclusion: This study is remarkable for symptomatic supraventricular tachycardia which is likely atrial tachycardia and occasional premature atrial complex.   Recent Labs: No results found for requested labs within last 8760 hours.  Recent Lipid Panel No results found for: CHOL, TRIG, HDL, CHOLHDL, VLDL, LDLCALC, LDLDIRECT  Physical Exam:    VS:  BP 100/78    Pulse 74    Ht  (1.499 m)    Wt 189 lb 12.8 oz (86.1 kg)    SpO2 98%     BMI 38.33 kg/m     Wt Readings from Last 3 Encounters:  12/12/20 189 lb 12.8 oz (86.1 kg)  11/08/20 184 lb 6.4 oz (83.6 kg)  03/17/14 184 lb 9.6 oz (83.7 kg)     GEN: Well nourished, well developed in no acute distress HEENT: Normal NECK: No JVD; No carotid bruits LYMPHATICS: No lymphadenopathy CARDIAC: S1S2 noted,RRR, no murmurs, rubs, gallops RESPIRATORY:  Clear to auscultation without rales, wheezing or rhonchi  ABDOMEN: Soft, non-tender, non-distended, +bowel sounds, no guarding. EXTREMITIES: No edema, No cyanosis, no clubbing MUSCULOSKELETAL:  No deformity  SKIN: Warm and dry NEUROLOGIC:  Alert and oriented x 3, non-focal PSYCHIATRIC:  Normal affect, good insight  ASSESSMENT:    1. PAT (paroxysmal atrial tachycardia) (HCC)   2. PAC (premature atrial contraction)   3. Obesity (BMI 30-39.9)   4. Metabolic syndrome    PLAN:    We discussed her monitor results.  At this time given her symptoms have started patient on propanolol 20 mg twice a day.  I was able to answer all of her questions while she was in the office today.  The patient understands the need to lose weight with diet and exercise. We have discussed specific strategies for this.  The patient is in agreement with the above plan. The patient left the office in stable condition.  The patient will follow up in   Medication Adjustments/Labs and Tests Ordered: Current medicines are reviewed at length with the patient today.  Concerns regarding medicines are outlined above.  No orders of the defined types were placed in this encounter.  Meds ordered this encounter  Medications   propranolol (INDERAL) 20 MG tablet    Sig: Take 1 tablet (20 mg total) by mouth 2 (two) times daily.    Dispense:  60 tablet    Refill:  2    Patient Instructions  .Medication Instructions:  Your physician has recommended you make the following change in your medication:   START: Propranolol 20 mg twice daily   *If you need a  refill on your cardiac medications before your next appointment, please call your pharmacy*   Lab Work: None If you have labs (blood work) drawn today and your tests are completely normal, you will receive your results only by:  MyChart Message (if you have MyChart) OR  A paper copy in the mail If you have any lab test that is abnormal or we need to change your treatment, we will call you to review the results.   Testing/Procedures: None   Follow-Up: At [facility], you and your health needs are our priority.  As part of our continuing mission to provide you with exceptional heart care, we have created designated Provider Care Teams.  These Care Teams include your primary Cardiologist (physician) and Advanced Practice Providers (APPs -  Physician Assistants and Nurse Practitioners) who all work together to provide you with the care you need, when you need it.  We recommend signing up for the patient portal called "MyChart".  Sign up information is provided on this After Visit Summary.  MyChart is used to connect with patients for Virtual Visits (Telemedicine).  Patients are able to view lab/test results, encounter notes, upcoming appointments, etc.  Non-urgent messages can be sent to your provider as well.   To learn more about what you can do with MyChart, go to [email or web address].    Your next appointment:   6 week(s)  The format for your next appointment:   In Person  Provider:   Thomasene Ripple, DO   Other Instructions       Adopting a Healthy Lifestyle.  Know what a healthy weight is for you (roughly BMI <25) and aim to maintain this   Aim for 7+ servings of fruits and vegetables daily   65-80+ fluid ounces of water or unsweet tea for healthy kidneys   Limit to max 1 drink of alcohol per day; avoid smoking/tobacco   Limit animal fats in diet for cholesterol and heart health - choose grass fed whenever available   Avoid highly processed foods, and foods high  in saturated/trans fats   Aim for low stress - take time to unwind and care for your mental health   Aim for 150 min of moderate intensity exercise weekly for heart health, and weights twice weekly for bone health   Aim for 7-9 hours of sleep daily   When it comes to diets, agreement about the perfect plan isnt easy to find, even among the experts. Experts at the [facility] of [employer] developed an idea known as the Healthy Eating Plate. Just imagine a plate divided into logical, healthy portions.   The emphasis is on diet quality:   Load up on vegetables  and fruits - one-half of your plate: Aim for color and variety, and remember that potatoes dont count.   Go for whole grains - one-quarter of your plate: Whole wheat, barley, wheat berries, quinoa, oats, brown rice, and foods made with them. If you want pasta, go with whole wheat pasta.   Protein power - one-quarter of your plate: Fish, chicken, beans, and nuts are all healthy, versatile protein sources. Limit red meat.   The diet, however, does go beyond the plate, offering a few other suggestions.   Use healthy plant oils, such as olive, canola, soy, corn, sunflower and peanut. Check the labels, and avoid partially hydrogenated oil, which have unhealthy trans fats.   If youre thirsty, drink water. Coffee and tea are good in moderation, but skip sugary drinks and limit milk and dairy products to one or two daily servings.   The type of carbohydrate in the diet is more important than the amount. Some sources of carbohydrates, such as vegetables, fruits, whole grains, and beans-are healthier than others.   Finally, stay active  Signed, Thomasene Ripple, DO  12/12/2020 3:34 PM    [facility]   Medication Adjustments/Labs and Tests Ordered: Current medicines are reviewed at length with the patient today.  Concerns regarding medicines are outlined above.  No orders of the defined types were placed in  this encounter.  Meds ordered this encounter  Medications   propranolol (INDERAL) 20 MG tablet    Sig: Take 1 tablet (20 mg total) by mouth 2 (two) times daily.    Dispense:  60 tablet    Refill:  2    Patient Instructions  .Medication Instructions:  Your physician has recommended you make the following change in your medication:   START: Propranolol 20 mg twice daily   *If you need a refill on your cardiac medications before your next appointment, please call your pharmacy*   Lab Work: None If you have labs (blood work) drawn today and your tests are completely normal, you will receive your results only by:  MyChart Message (if you have MyChart) OR  A paper copy in the mail If you have any lab test that is abnormal or we need to change your treatment, we will call you to review the results.   Testing/Procedures: None   Follow-Up: At [facility], you and your health needs are our priority.  As part of our continuing mission to provide you with exceptional heart care, we have created designated Provider Care Teams.  These Care Teams include your primary Cardiologist (physician) and Advanced Practice Providers (APPs -  Physician Assistants and Nurse Practitioners) who all work together to provide you with the care you need, when you need it.  We recommend signing up for the patient portal called "MyChart".  Sign up information is provided on this After Visit Summary.  MyChart is used to connect with patients for Virtual Visits (Telemedicine).  Patients are able to view lab/test results, encounter notes, upcoming appointments, etc.  Non-urgent messages can be sent to your provider as well.   To learn more about what you can do with MyChart, go to [email or web address].    Your next appointment:   6 week(s)  The format for your next appointment:   In Person  Provider:   Thomasene Ripple, DO   Other Instructions       Adopting a Healthy Lifestyle.  Know what a  healthy weight is for you (roughly BMI <25) and aim to  maintain this   Aim for 7+ servings of fruits and vegetables daily   65-80+ fluid ounces of water or unsweet tea for healthy kidneys   Limit to max 1 drink of alcohol per day; avoid smoking/tobacco   Limit animal fats in diet for cholesterol and heart health - choose grass fed whenever available   Avoid highly processed foods, and foods high in saturated/trans fats   Aim for low stress - take time to unwind and care for your mental health   Aim for 150 min of moderate intensity exercise weekly for heart health, and weights twice weekly for bone health   Aim for 7-9 hours of sleep daily   When it comes to diets, agreement about the perfect plan isnt easy to find, even among the experts. Experts at the [employer] developed an idea known as the Healthy Eating Plate. Just imagine a plate divided into logical, healthy portions.   The emphasis is on diet quality:   Load up on vegetables and fruits - one-half of your plate: Aim for color and variety, and remember that potatoes dont count.   Go for whole grains - one-quarter of your plate: Whole wheat, barley, wheat berries, quinoa, oats, brown rice, and foods made with them. If you want pasta, go with whole wheat pasta.   Protein power - one-quarter of your plate: Fish, chicken, beans, and nuts are all healthy, versatile protein sources. Limit red meat.   The diet, however, does go beyond the plate, offering a few other suggestions.   Use healthy plant oils, such as olive, canola, soy, corn, sunflower and peanut. Check the labels, and avoid partially hydrogenated oil, which have unhealthy trans fats.   If youre thirsty, drink water. Coffee and tea are good in moderation, but skip sugary drinks and limit milk and dairy products to one or two daily servings.   The type of carbohydrate in the diet is more important than the amount. Some sources of carbohydrates,  such as vegetables, fruits, whole grains, and beans-are healthier than others.   Finally, stay active  Signed, Thomasene RippleKardie Cylus [place], DO  12/12/2020 3:34 PM    [facility]

## 2020-12-12 NOTE — Patient Instructions (Signed)
.  Medication Instructions:  Your physician has recommended you make the following change in your medication:   START: Propranolol 20 mg twice daily   *If you need a refill on your cardiac medications before your next appointment, please call your pharmacy*   Lab Work: None If you have labs (blood work) drawn today and your tests are completely normal, you will receive your results only by: Marland Kitchen MyChart Message (if you have MyChart) OR . A paper copy in the mail If you have any lab test that is abnormal or we need to change your treatment, we will call you to review the results.   Testing/Procedures: None   Follow-Up: At [facility], you and your health needs are our priority.  As part of our continuing mission to provide you with exceptional heart care, we have created designated Provider Care Teams.  These Care Teams include your primary Cardiologist (physician) and Advanced Practice Providers (APPs -  Physician Assistants and Nurse Practitioners) who all work together to provide you with the care you need, when you need it.  We recommend signing up for the patient portal called "MyChart".  Sign up information is provided on this After Visit Summary.  MyChart is used to connect with patients for Virtual Visits (Telemedicine).  Patients are able to view lab/test results, encounter notes, upcoming appointments, etc.  Non-urgent messages can be sent to your provider as well.   To learn more about what you can do with MyChart, go to [email or web address].    Your next appointment:   6 week(s)  The format for your next appointment:   In Person  Provider:   Thomasene Ripple, DO   Other Instructions

## 2020-12-14 ENCOUNTER — Other Ambulatory Visit: Payer: BC Managed Care – PPO

## 2021-01-03 ENCOUNTER — Ambulatory Visit: Payer: BC Managed Care – PPO | Admitting: Cardiology

## 2021-01-24 ENCOUNTER — Other Ambulatory Visit: Payer: Self-pay

## 2021-01-24 ENCOUNTER — Encounter: Payer: Self-pay | Admitting: Cardiology

## 2021-01-24 ENCOUNTER — Ambulatory Visit: Payer: BC Managed Care – PPO | Admitting: Cardiology

## 2021-01-24 VITALS — BP 106/60 | HR 70 | Ht 59.0 in | Wt 187.0 lb

## 2021-01-24 DIAGNOSIS — E669 Obesity, unspecified: Secondary | ICD-10-CM | POA: Diagnosis not present

## 2021-01-24 DIAGNOSIS — R5383 Other fatigue: Secondary | ICD-10-CM | POA: Insufficient documentation

## 2021-01-24 DIAGNOSIS — I491 Atrial premature depolarization: Secondary | ICD-10-CM

## 2021-01-24 DIAGNOSIS — E8881 Metabolic syndrome: Secondary | ICD-10-CM

## 2021-01-24 DIAGNOSIS — E6609 Other obesity due to excess calories: Secondary | ICD-10-CM

## 2021-01-24 DIAGNOSIS — Z6836 Body mass index (BMI) 36.0-36.9, adult: Secondary | ICD-10-CM

## 2021-01-24 DIAGNOSIS — I471 Supraventricular tachycardia: Secondary | ICD-10-CM

## 2021-01-24 MED ORDER — PROPRANOLOL HCL 40 MG PO TABS: 40.0000 mg | ORAL_TABLET | Freq: Every day | ORAL | 3 refills | Status: DC

## 2021-01-24 NOTE — Progress Notes (Signed)
Cardiology Office Note:    Date:  01/24/2021   ID:  Cristina Mora, DOB Dec 14, 1971, MRN [id number]  PCP:  Paulina Fusi, MD  Cardiologist:  Thomasene Ripple, DO  Electrophysiologist:  None   Referring MD: Paulina Fusi, MD   " I doing okay with the palpitations but I feel like the propanolol is making her tired  History of Present Illness:    Cristina Mora is a 51 y.o. female with a hx of hypothyroidism, metabolic syndrome, obesity, comes today for follow-up visit.  Did see the patient on November 08, 2020 at that time she reported she has had some lightheadedness and dizziness with some episodes of palpitations.  During her visit systolic murmurs were heard I therefore placed a monitor and the patient as well as ZIO monitor to understand her PACs as well as palpitations.   I saw the patient on December 12, 2020 at that time I started the patient on propanolol twice daily for occasional PACs as well as symptomatic atrial tachycardia.  She tells me have been improving her symptoms but she is getting really tired.  She reports that the propanolol has helped the palpitation symptoms but she feels very tired since starting his medication.  Past Medical History:  Diagnosis Date  . 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 At high risk for tick borne illness 10/22/2017  . Class 2 obesity due to excess calories without serious comorbidity with body mass index (BMI) of 36.0 to 36.9 in adult 05/19/2020  . 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 Hypothyroid 11/06/2020  . 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 Tick bite 10/22/2017    History reviewed. No pertinent surgical history.  Current Medications: Current Meds  Medication Sig  . cetirizine (ZYRTEC) 10 MG tablet Take 10 mg by mouth.  . diazepam (VALIUM) 2 MG tablet Take 2 mg by mouth 2 (two) times daily.  . ergocalciferol (VITAMIN D2) 1.25 MG (50000 UT) capsule Take 1 capsule by mouth once a week.   . Insulin Pen Needle (BD PEN NEEDLE NANO U/F) 32G X 4 MM MISC Inject once daily  . levonorgestrel (MIRENA) 20 MCG/24HR IUD by Intrauterine route.  Marland Kitchen levothyroxine (SYNTHROID, LEVOTHROID) 50 MCG tablet Take 50 mcg by mouth daily before breakfast.  . NP THYROID 30 MG tablet Take by mouth.  . propranolol (INDERAL) 40 MG tablet Take 1 tablet (40 mg total) by mouth at bedtime.  . rosuvastatin (CRESTOR) 5 MG tablet Take 5 mg by mouth daily.  . Semaglutide,0.25 or 0.5MG /DOS, 2 MG/1.5ML SOPN Inject into the skin.  Marland Kitchen venlafaxine XR (EFFEXOR-XR) 37.5 MG 24 hr capsule   . [DISCONTINUED] propranolol (INDERAL) 20 MG tablet Take 1 tablet (20 mg total) by mouth 2 (two) times daily.     Allergies:   Ampicillin, Other, and Penicillins   Social History   Socioeconomic History  . Marital status: Single    Spouse name: Not on file  . Number of children: Not on file  . Years of education: Not on file  . Highest education level: Not on file  Occupational History  . Not on file  Tobacco Use  . Smoking status: Former Smoker    Quit date: 03/17/2006    Years since quitting: 14.8  . Smokeless tobacco: Never Used  Substance and Sexual Activity  . Alcohol use: Yes  . Drug use: No  . Sexual activity: Not on file  Other Topics Concern  . Not  on file  Social History Narrative  . Not on file   Social Determinants of Health   Financial Resource Strain: Not on file  Food Insecurity: Not on file  Transportation Needs: Not on file  Physical Activity: Not on file  Stress: Not on file  Social Connections: Not on file     Family History: The patient's family history is not on file.  ROS:   Review of Systems  Constitution: Negative for decreased appetite, fever and weight gain.  HENT: Negative for congestion, ear discharge, hoarse voice and sore throat.   Eyes: Negative for discharge, redness, vision loss in right eye and visual halos.  Cardiovascular: Negative for chest pain, dyspnea on exertion,  leg swelling, orthopnea and palpitations.  Respiratory: Negative for cough, hemoptysis, shortness of breath and snoring.   Endocrine: Negative for heat intolerance and polyphagia.  Hematologic/Lymphatic: Negative for bleeding problem. Does not bruise/bleed easily.  Skin: Negative for flushing, nail changes, rash and suspicious lesions.  Musculoskeletal: Negative for arthritis, joint pain, muscle cramps, myalgias, neck pain and stiffness.  Gastrointestinal: Negative for abdominal pain, bowel incontinence, diarrhea and excessive appetite.  Genitourinary: Negative for decreased libido, genital sores and incomplete emptying.  Neurological: Negative for brief paralysis, focal weakness, headaches and loss of balance.  Psychiatric/Behavioral: Negative for altered mental status, depression and suicidal ideas.  Allergic/Immunologic: Negative for HIV exposure and persistent infections.    EKGs/Labs/Other Studies Reviewed:    The following studies were reviewed today:   EKG: None today  Transthoracic echocardiogram IMPRESSIONS  1. Left ventricular ejection fraction, by estimation, is 60 to 65%. The  left ventricle has normal function. The left ventricle has no regional  wall motion abnormalities. Left ventricular diastolic parameters were  normal.  2. Right ventricular systolic function is normal. The right ventricular  size is normal. There is normal pulmonary artery systolic pressure.  3. The mitral valve is normal in structure. No evidence of mitral valve  regurgitation. No evidence of mitral stenosis.  4. The aortic valve is normal in structure. Aortic valve regurgitation is  not visualized. No aortic stenosis is present.  5. The inferior vena cava is normal in size with greater than 50%  respiratory variability, suggesting right atrial pressure of 3 mmHg.   ZIO monitor The patient wore the monitor for 13 days, 13 hours starting November 08, 2020. Indication: Palpitations  The  minimum heart rate was 49 bpm, maximum heart rate was 272 bpm, and average heart rate was 89 bpm. Predominant underlying rhythm was Sinus Rhythm.  5 Supraventricular Tachycardia runs occurred, the run with the fastest interval lasting 19.1 secs with a maximum heart rate of 272 bpm (average 259 bpm); the run with the fastest interval was also the longest.  Premature atrial complexes were occasional (1.4%, 23146). Premature Ventricular complexes were rare less than 1%.  No pauses, No AV block and no atrial fibrillation present. 4 patient triggered events: all 4 associated with supraventricular tachycardia4  6 Diary events: 1 associated with supraventricular tachycardia, remaining associated with premature atrial complex and premature ventricular complex.  Conclusion: This study is remarkable for symptomatic supraventricular tachycardia which is likely atrial tachycardia and occasional premature atrial complex.  Recent Labs: No results found for requested labs within last 8760 hours.  Recent Lipid Panel No results found for: CHOL, TRIG, HDL, CHOLHDL, VLDL, LDLCALC, LDLDIRECT  Physical Exam:    VS:  BP 106/60   Pulse 70   Ht 4\' 11"  (1.499 m)   Wt 187 lb (  84.8 kg)   SpO2 98%   BMI 37.77 kg/m     Wt Readings from Last 3 Encounters:  01/24/21 187 lb (84.8 kg)  12/12/20 189 lb 12.8 oz (86.1 kg)  11/08/20 184 lb 6.4 oz (83.6 kg)     GEN: Well nourished, well developed in no acute distress HEENT: Normal NECK: No JVD; No carotid bruits LYMPHATICS: No lymphadenopathy CARDIAC: S1S2 noted,RRR, no murmurs, rubs, gallops RESPIRATORY:  Clear to auscultation without rales, wheezing or rhonchi  ABDOMEN: Soft, non-tender, non-distended, +bowel sounds, no guarding. EXTREMITIES: No edema, No cyanosis, no clubbing MUSCULOSKELETAL:  No deformity  SKIN: Warm and dry NEUROLOGIC:  Alert and oriented x 3, non-focal PSYCHIATRIC:  Normal affect, good insight  ASSESSMENT:    1. PAT  (paroxysmal atrial tachycardia) (HCC)   2. PAC (premature atrial contraction)   3. Obesity (BMI 30-39.9)   4. Metabolic syndrome   5. Class 2 obesity due to excess calories without serious comorbidity with body mass index (BMI) of 36.0 to 36.9 in adult   6. Other fatigue   7. Fatigue, unspecified type    PLAN:    We have discussed and what we will do is have the patient take her propanolol 40 mg at nighttime.  She will let me know how this progression is going.  Also for her fatigue and metabolic syndrome I like to get a vitamin D level in this patient today.  The patient understands the need to lose weight with diet and exercise. We have discussed specific strategies for this.   The patient is in agreement with the above plan. The patient left the office in stable condition.  The patient will follow up in 6 months or sooner if needed.   Medication Adjustments/Labs and Tests Ordered: Current medicines are reviewed at length with the patient today.  Concerns regarding medicines are outlined above.  Orders Placed This Encounter  Procedures  . Vitamin D (25 hydroxy)   Meds ordered this encounter  Medications  . propranolol (INDERAL) 40 MG tablet    Sig: Take 1 tablet (40 mg total) by mouth at bedtime.    Dispense:  60 tablet    Refill:  3    Patient Instructions  Medication Instructions:  Your physician has recommended you make the following change in your medication:   START: PROPANOLOL 40 MG DAILY AT BEDTIME *If you need a refill on your cardiac medications before your next appointment, please call your pharmacy*   Lab Work: Your physician recommends that you return for lab work in:   VITAMIN D If you have labs (blood work) drawn today and your tests are completely normal, you will receive your results only by: Marland Kitchen MyChart Message (if you have MyChart) OR . A paper copy in the mail If you have any lab test that is abnormal or we need to change your treatment, we will  call you to review the results.   Testing/Procedures: none   Follow-Up: At [facility], you and your health needs are our priority.  As part of our continuing mission to provide you with exceptional heart care, we have created designated Provider Care Teams.  These Care Teams include your primary Cardiologist (physician) and Advanced Practice Providers (APPs -  Physician Assistants and Nurse Practitioners) who all work together to provide you with the care you need, when you need it.  We recommend signing up for the patient portal called "MyChart".  Sign up information is provided on this After Visit Summary.  MyChart is used to connect with patients for Virtual Visits (Telemedicine).  Patients are able to view lab/test results, encounter notes, upcoming appointments, etc.  Non-urgent messages can be sent to your provider as well.   To learn more about what you can do with MyChart, go to [email or web address].    Your next appointment:   6 month(s)  The format for your next appointment:   In Person  Provider:   Thomasene Ripple, DO   Other Instructions      Adopting a Healthy Lifestyle.  Know what a healthy weight is for you (roughly BMI <25) and aim to maintain this   Aim for 7+ servings of fruits and vegetables daily   65-80+ fluid ounces of water or unsweet tea for healthy kidneys   Limit to max 1 drink of alcohol per day; avoid smoking/tobacco   Limit animal fats in diet for cholesterol and heart health - choose grass fed whenever available   Avoid highly processed foods, and foods high in saturated/trans fats   Aim for low stress - take time to unwind and care for your mental health   Aim for 150 min of moderate intensity exercise weekly for heart health, and weights twice weekly for bone health   Aim for 7-9 hours of sleep daily   When it comes to diets, agreement about the perfect plan isnt easy to find, even among the experts. Experts at the [facility]  [employer] developed an idea known as the Healthy Eating Plate. Just imagine a plate divided into logical, healthy portions.   The emphasis is on diet quality:   Load up on vegetables and fruits - one-half of your plate: Aim for color and variety, and remember that potatoes dont count.   Go for whole grains - one-quarter of your plate: Whole wheat, barley, wheat berries, quinoa, oats, brown rice, and foods made with them. If you want pasta, go with whole wheat pasta.   Protein power - one-quarter of your plate: Fish, chicken, beans, and nuts are all healthy, versatile protein sources. Limit red meat.   The diet, however, does go beyond the plate, offering a few other suggestions.   Use healthy plant oils, such as olive, canola, soy, corn, sunflower and peanut. Check the labels, and avoid partially hydrogenated oil, which have unhealthy trans fats.   If youre thirsty, drink water. Coffee and tea are good in moderation, but skip sugary drinks and limit milk and dairy products to one or two daily servings.   The type of carbohydrate in the diet is more important than the amount. Some sources of carbohydrates, such as vegetables, fruits, whole grains, and beans-are healthier than others.   Finally, stay active  Signed, Thomasene Ripple, DO  01/24/2021 4:37 PM    [facility]

## 2021-01-24 NOTE — Patient Instructions (Signed)
Medication Instructions:  Your physician has recommended you make the following change in your medication:   START: PROPANOLOL 40 MG DAILY AT BEDTIME *If you need a refill on your cardiac medications before your next appointment, please call your pharmacy*   Lab Work: Your physician recommends that you return for lab work in:   VITAMIN D If you have labs (blood work) drawn today and your tests are completely normal, you will receive your results only by: Marland Kitchen MyChart Message (if you have MyChart) OR . A paper copy in the mail If you have any lab test that is abnormal or we need to change your treatment, we will call you to review the results.   Testing/Procedures: none   Follow-Up: At [facility], you and your health needs are our priority.  As part of our continuing mission to provide you with exceptional heart care, we have created designated Provider Care Teams.  These Care Teams include your primary Cardiologist (physician) and Advanced Practice Providers (APPs -  Physician Assistants and Nurse Practitioners) who all work together to provide you with the care you need, when you need it.  We recommend signing up for the patient portal called "MyChart".  Sign up information is provided on this After Visit Summary.  MyChart is used to connect with patients for Virtual Visits (Telemedicine).  Patients are able to view lab/test results, encounter notes, upcoming appointments, etc.  Non-urgent messages can be sent to your provider as well.   To learn more about what you can do with MyChart, go to [email or web address].    Your next appointment:   6 month(s)  The format for your next appointment:   In Person  Provider:   Thomasene Ripple, DO   Other Instructions

## 2021-01-25 LAB — VITAMIN D 25 HYDROXY (VIT D DEFICIENCY, FRACTURES): Vit D, 25-Hydroxy: 41.5 ng/mL (ref 30.0–100.0)

## 2021-02-12 ENCOUNTER — Other Ambulatory Visit: Payer: Self-pay | Admitting: Cardiology

## 2021-02-12 NOTE — Telephone Encounter (Signed)
Rx refill sent to pharmacy. 

## 2021-03-12 ENCOUNTER — Other Ambulatory Visit: Payer: Self-pay | Admitting: Cardiology

## 2021-03-12 NOTE — Telephone Encounter (Signed)
Pharmacy instruction corrected from Propranolol 1 bid to 1 tab at bedtime after reviewing 01/24/2021 per Dr. Mallory Shirk note. Rx approved and sent

## 2021-05-16 ENCOUNTER — Other Ambulatory Visit: Payer: Self-pay | Admitting: Cardiology

## 2021-05-22 ENCOUNTER — Telehealth: Payer: Self-pay | Admitting: Cardiology
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
## 2021-05-23 ENCOUNTER — Encounter: Payer: Self-pay | Admitting: Cardiology

## 2021-05-23 ENCOUNTER — Encounter: Payer: Self-pay | Admitting: *Deleted

## 2021-05-23 ENCOUNTER — Ambulatory Visit: Payer: BC Managed Care – PPO | Admitting: Cardiology

## 2021-05-23 ENCOUNTER — Other Ambulatory Visit: Payer: Self-pay

## 2021-05-23 VITALS — BP 112/60 | HR 88 | Ht 59.0 in | Wt 190.4 lb

## 2021-05-23 DIAGNOSIS — I471 Supraventricular tachycardia: Secondary | ICD-10-CM

## 2021-05-23 DIAGNOSIS — R55 Syncope and collapse: Secondary | ICD-10-CM

## 2021-05-23 DIAGNOSIS — R5383 Other fatigue: Secondary | ICD-10-CM

## 2021-05-23 DIAGNOSIS — I491 Atrial premature depolarization: Secondary | ICD-10-CM | POA: Diagnosis not present

## 2021-05-23 DIAGNOSIS — E669 Obesity, unspecified: Secondary | ICD-10-CM

## 2021-05-23 NOTE — Patient Instructions (Signed)
Medication Instructions:  Your physician recommends that you continue on your current medications as directed. Please refer to the Current Medication list given to you today.  *If you need a refill on your cardiac medications before your next appointment, please call your pharmacy*   Lab Work: NONE If you have labs (blood work) drawn today and your tests are completely normal, you will receive your results only by: Marland Kitchen MyChart Message (if you have MyChart) OR . A paper copy in the mail If you have any lab test that is abnormal or we need to change your treatment, we will call you to review the results.   Testing/Procedures: NONE   Follow-Up: At [facility], you and your health needs are our priority.  As part of our continuing mission to provide you with exceptional heart care, we have created designated Provider Care Teams.  These Care Teams include your primary Cardiologist (physician) and Advanced Practice Providers (APPs -  Physician Assistants and Nurse Practitioners) who all work together to provide you with the care you need, when you need it.  We recommend signing up for the patient portal called "MyChart".  Sign up information is provided on this After Visit Summary.  MyChart is used to connect with patients for Virtual Visits (Telemedicine).  Patients are able to view lab/test results, encounter notes, upcoming appointments, etc.  Non-urgent messages can be sent to your provider as well.   To learn more about what you can do with MyChart, go to [email or web address].    Your next appointment:   12 week(s)  The format for your next appointment:   In Person  Provider:   Thomasene Ripple, DO   Other Instructions
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
## 2021-06-14 ENCOUNTER — Ambulatory Visit: Payer: BC Managed Care – PPO | Admitting: Cardiology

## 2021-06-18 ENCOUNTER — Other Ambulatory Visit: Payer: Self-pay | Admitting: Cardiology

## 2021-08-15 ENCOUNTER — Encounter: Payer: Self-pay | Admitting: Neurology

## 2021-08-29 ENCOUNTER — Other Ambulatory Visit: Payer: Self-pay

## 2021-08-29 ENCOUNTER — Encounter: Payer: Self-pay | Admitting: Cardiology

## 2021-08-29 ENCOUNTER — Ambulatory Visit: Payer: BC Managed Care – PPO | Admitting: Cardiology

## 2021-08-29 VITALS — BP 110/78 | HR 74 | Ht 59.0 in | Wt 191.0 lb

## 2021-08-29 DIAGNOSIS — E8881 Metabolic syndrome: Secondary | ICD-10-CM

## 2021-08-29 DIAGNOSIS — E669 Obesity, unspecified: Secondary | ICD-10-CM

## 2021-08-29 DIAGNOSIS — I471 Supraventricular tachycardia: Secondary | ICD-10-CM | POA: Diagnosis not present

## 2021-08-29 DIAGNOSIS — I491 Atrial premature depolarization: Secondary | ICD-10-CM | POA: Diagnosis not present
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
## 2021-08-29 NOTE — Patient Instructions (Signed)
Medication Instructions:  No medication changes. *If you need a refill on your cardiac medications before your next appointment, please call your pharmacy*   Lab Work: None ordered If you have labs (blood work) drawn today and your tests are completely normal, you will receive your results only by: MyChart Message (if you have MyChart) OR A paper copy in the mail If you have any lab test that is abnormal or we need to change your treatment, we will call you to review the results.   Testing/Procedures: None ordered   Follow-Up: At [facility], you and your health needs are our priority.  As part of our continuing mission to provide you with exceptional heart care, we have created designated Provider Care Teams.  These Care Teams include your primary Cardiologist (physician) and Advanced Practice Providers (APPs -  Physician Assistants and Nurse Practitioners) who all work together to provide you with the care you need, when you need it.  We recommend signing up for the patient portal called "MyChart".  Sign up information is provided on this After Visit Summary.  MyChart is used to connect with patients for Virtual Visits (Telemedicine).  Patients are able to view lab/test results, encounter notes, upcoming appointments, etc.  Non-urgent messages can be sent to your provider as well.   To learn more about what you can do with MyChart, go to [email or web address].    Your next appointment:   12 month(s)  The format for your next appointment:   In Person  Provider:   Thomasene Ripple, MD   Other Instructions NA

## 2021-11-08 ENCOUNTER — Ambulatory Visit: Payer: BC Managed Care – PPO | Admitting: Neurology

## 2022-02-22 ENCOUNTER — Other Ambulatory Visit: Payer: Self-pay | Admitting: Cardiology

## 2022-05-02 ENCOUNTER — Other Ambulatory Visit: Payer: Self-pay | Admitting: Cardiology

## 2022-05-14 ENCOUNTER — Other Ambulatory Visit: Payer: Self-pay | Admitting: Surgery

## 2022-05-14 DIAGNOSIS — K9509 Other complications of gastric band procedure: Secondary | ICD-10-CM

## 2022-05-14 DIAGNOSIS — R112 Nausea with vomiting, unspecified: Secondary | ICD-10-CM

## 2022-05-28 ENCOUNTER — Other Ambulatory Visit: Payer: Self-pay | Admitting: Surgery

## 2022-05-28 DIAGNOSIS — R112 Nausea with vomiting, unspecified: Secondary | ICD-10-CM

## 2022-06-06 ENCOUNTER — Ambulatory Visit
Admission: RE | Admit: 2022-06-06 | Discharge: 2022-06-06 | Disposition: A | Discharge status: Home / Self Care | Payer: BC Managed Care – PPO | Source: Ambulatory Visit | Attending: Surgery | Admitting: Surgery

## 2022-06-06 ENCOUNTER — Other Ambulatory Visit: Payer: BC Managed Care – PPO

## 2022-06-06 ENCOUNTER — Other Ambulatory Visit: Payer: Self-pay | Admitting: Surgery

## 2022-06-06 DIAGNOSIS — K9509 Other complications of gastric band procedure: Secondary | ICD-10-CM

## 2022-06-06 DIAGNOSIS — R112 Nausea with vomiting, unspecified: Secondary | ICD-10-CM

## 2022-06-25 IMAGING — MR MR HEAD W/O CM
10 series · 48 of 48 positions shown · non-contrast
Comparison: MRI head May 07, 2008

CLINICAL DATA: Vision distortion.  Prior fall.

EXAM:
MRI HEAD WITHOUT CONTRAST
TECHNIQUE: Multiplanar, multiecho pulse sequences of the brain and surrounding
structures were obtained without intravenous contrast.

[Series 5: T1 · sagittal · 4.0mm · 0.75mm/px · 3 of 30 slices shown (1 of 2)]
[im 1/30]
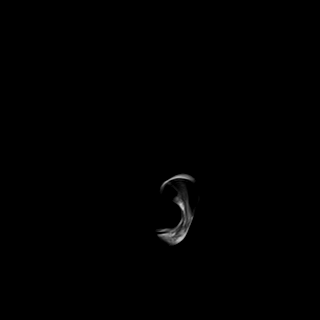
[im 15/30]
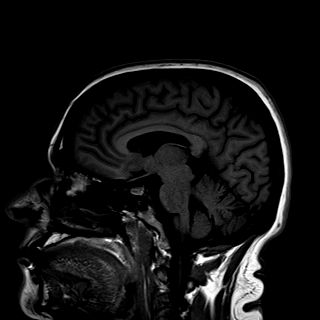
[im 30/30]
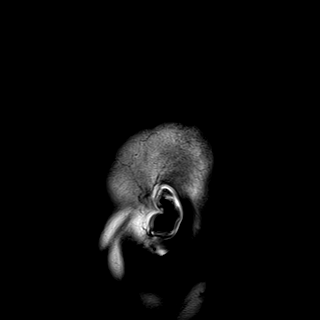

[Series 6: DWI · axial · 3.0mm · 1.44mm/px · z∈[-72,+63]mm · 7 of 84 slices shown (1 of 4)]
[im 1/84]
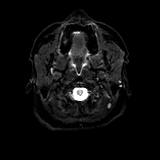
[im 14/84]
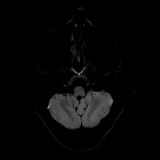
[im 28/84]
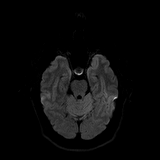
[im 42/84]
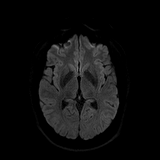
[im 56/84]
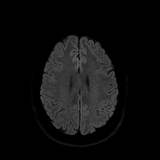
[im 70/84]
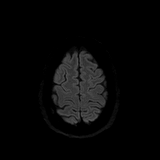
[im 84/84]
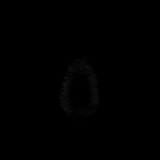

[Series 7: DWI · axial · 3.0mm · 1.44mm/px · z∈[-72,+63]mm · 3 of 41 slices shown (2 of 4)]
[im 1/41]
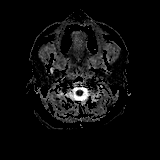
[im 21/41]
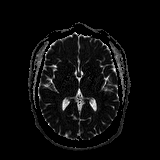
[im 41/41]
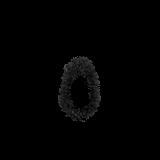

[Series 8: DWI · coronal · 5.0mm · 1.44mm/px · 5 of 62 slices shown (3 of 4)]
[im 1/62]
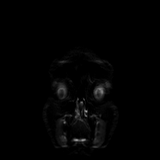
[im 16/62]
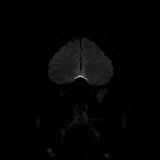
[im 31/62]
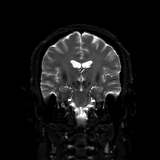
[im 46/62]
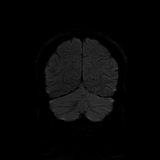
[im 62/62]
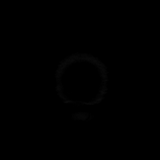

[Series 9: DWI · coronal · 5.0mm · 1.44mm/px · 3 of 31 slices shown (4 of 4)]
[im 1/31]
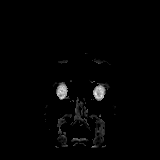
[im 16/31]
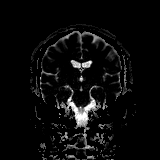
[im 31/31]
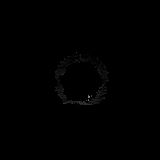

[Series 10: T2 · axial · 4.0mm · 0.36mm/px · z∈[-81,+69]mm · 2 of 30 slices shown (1 of 2)]
[im 1/30]
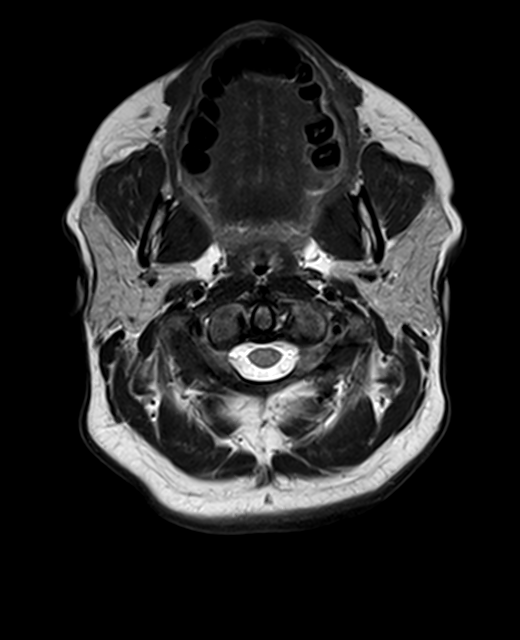
[im 30/30]
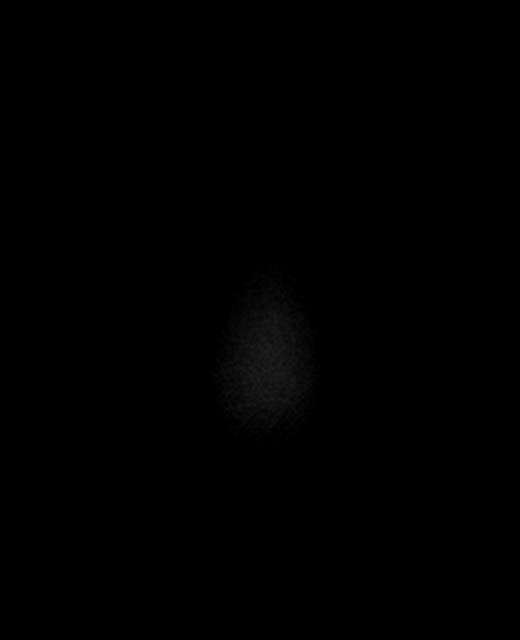

[Series 11: FLAIR · axial · 3.0mm · 0.72mm/px · z∈[-81,+68]mm · 2 of 26 slices shown]
[im 1/26]
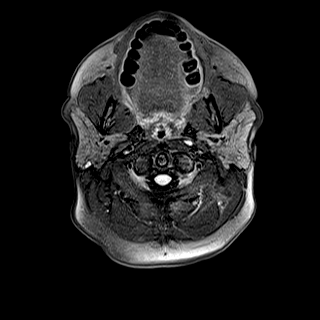
[im 26/26]
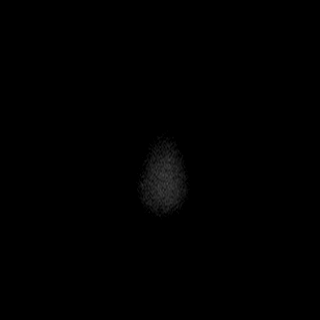

[Series 13: swi_images · axial · 1.5mm · 0.90mm/px · z∈[-77,+65]mm · 8 of 96 slices shown]
[im 1/96]
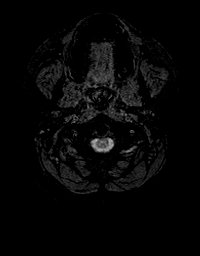
[im 14/96]
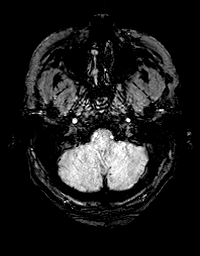
[im 28/96]
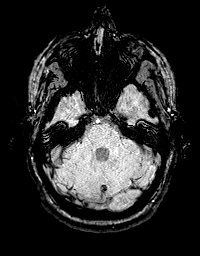
[im 41/96]
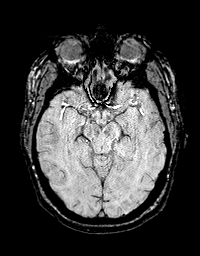
[im 55/96]
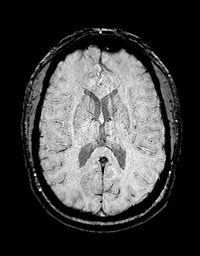
[im 68/96]
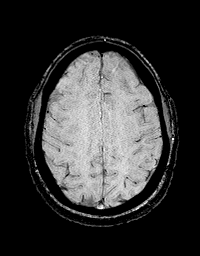
[im 82/96]
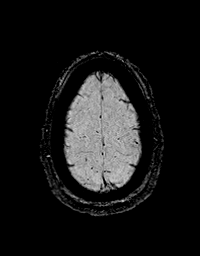
[im 96/96]
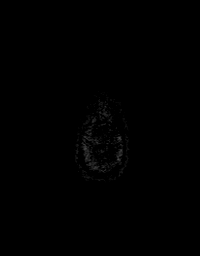

[Series 14: T1 · axial · 1.0mm · 0.94mm/px · z∈[-89,+51]mm · 12 of 144 slices shown (2 of 2)]
[im 1/144]
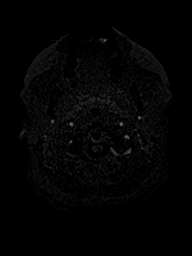
[im 14/144]
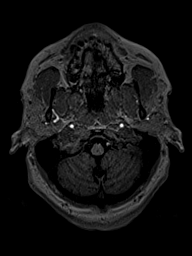
[im 27/144]
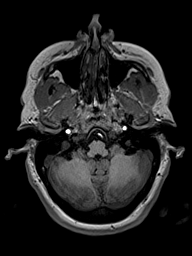
[im 40/144]
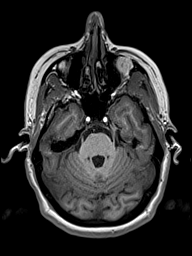
[im 53/144]
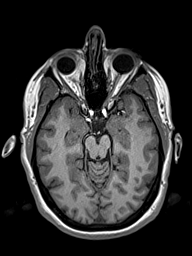
[im 66/144]
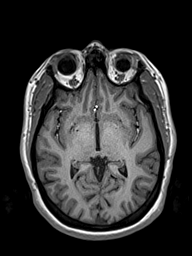
[im 79/144]
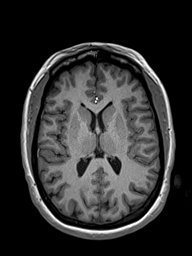
[im 92/144]
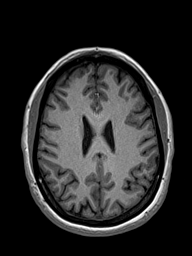
[im 105/144]
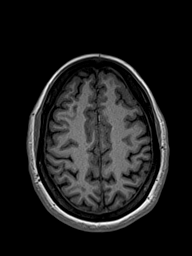
[im 118/144]
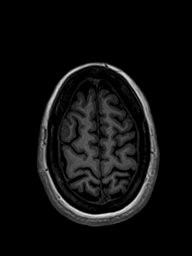
[im 131/144]
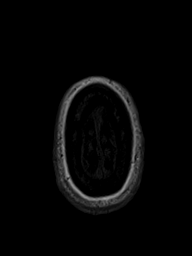
[im 144/144]
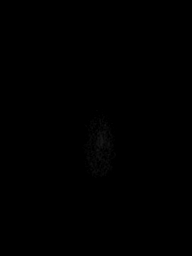

[Series 15: T2 · coronal · 4.5mm · 0.36mm/px · 3 of 33 slices shown (2 of 2)]
[im 1/33]
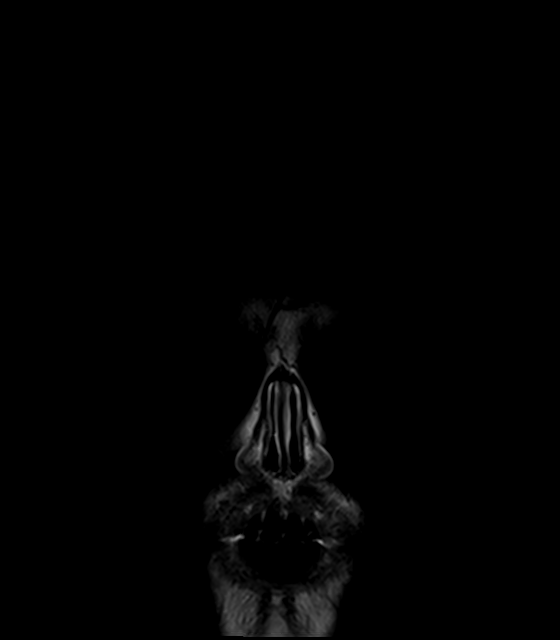
[im 17/33]
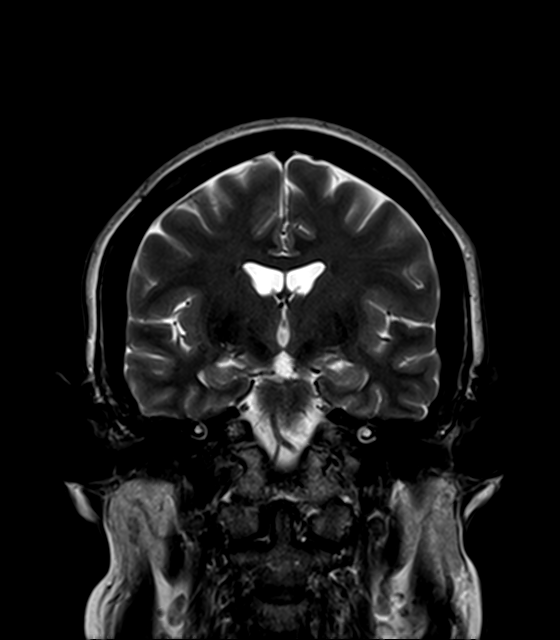
[im 33/33]
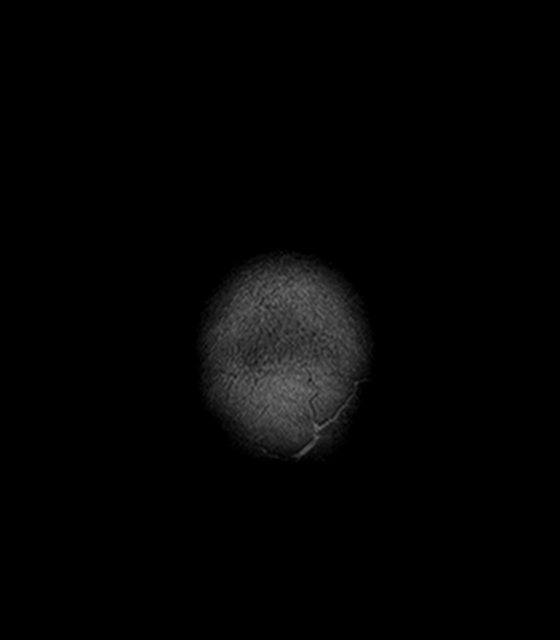

[48 of 48 positions shown; findings below may reference images not displayed]

FINDINGS: Brain: No acute infarction, hemorrhage, hydrocephalus, extra-axial
collection or mass lesion.

Vascular: Major arterial flow voids are maintained at the skull
base.

Skull and upper cervical spine: Normal marrow signal.

Sinuses/Orbits: Minimal ethmoid air cell mucosal thickening. Sinuses
are otherwise clear. Unremarkable orbits.

Other: Mucosal retention cysts in the posterior oropharynx. No
mastoid effusions.
IMPRESSION: No acute intracranial abnormality.

## 2022-08-23 ENCOUNTER — Telehealth: Payer: Self-pay | Admitting: Cardiology

## 2022-08-23 MED ORDER — PROPRANOLOL HCL 40 MG PO TABS: 40.0000 mg | ORAL_TABLET | Freq: Every day | ORAL | 1 refills | Status: DC

## 2022-08-23 NOTE — Telephone Encounter (Signed)
*  STAT* If patient is at the pharmacy, call can be transferred to refill team.   1. Which medications need to be refilled? (please list name of each medication and dose if known)   propranolol (INDERAL) 40 MG tablet    2. Which pharmacy/location (including street and city if local pharmacy) is medication to be sent to? [facility] - [place], [place] - [address]  3. Do they need a 30 day or 90 day supply?  90 day

## 2022-09-19 ENCOUNTER — Ambulatory Visit: Payer: BC Managed Care – PPO | Attending: Cardiology | Admitting: Cardiology

## 2022-09-19 ENCOUNTER — Encounter: Payer: Self-pay | Admitting: Cardiology

## 2022-09-19 VITALS — BP 104/66 | HR 67 | Ht 59.0 in | Wt 202.4 lb

## 2022-09-19 DIAGNOSIS — E8881 Metabolic syndrome: Secondary | ICD-10-CM

## 2022-09-19 DIAGNOSIS — I471 Supraventricular tachycardia: Secondary | ICD-10-CM | POA: Diagnosis not present

## 2022-09-19 DIAGNOSIS — I491 Atrial premature depolarization: Secondary | ICD-10-CM | POA: Diagnosis not present

## 2022-09-19 DIAGNOSIS — E669 Obesity, unspecified: Secondary | ICD-10-CM

## 2022-09-19 MED ORDER — FLECAINIDE ACETATE 50 MG PO TABS: 50.0000 mg | ORAL_TABLET | Freq: Two times a day (BID) | ORAL | 3 refills | Status: DC
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
## 2022-09-19 NOTE — Patient Instructions (Addendum)
Medication Instructions:  Your physician has recommended you make the following change in your medication:  START: Flecainide 50 mg twice daily *If you need a refill on your cardiac medications before your next appointment, please call your pharmacy*   Lab Work: None  Testing/Procedures: None   Follow-Up: At [facility], you and your health needs are our priority.  As part of our continuing mission to provide you with exceptional heart care, we have created designated Provider Care Teams.  These Care Teams include your primary Cardiologist (physician) and Advanced Practice Providers (APPs -  Physician Assistants and Nurse Practitioners) who all work together to provide you with the care you need, when you need it.  We recommend signing up for the patient portal called "MyChart".  Sign up information is provided on this After Visit Summary.  MyChart is used to connect with patients for Virtual Visits (Telemedicine).  Patients are able to view lab/test results, encounter notes, upcoming appointments, etc.  Non-urgent messages can be sent to your provider as well.   To learn more about what you can do with MyChart, go to [email or web address].    Your next appointment:   12 week(s)  The format for your next appointment:   In Person  Provider:   Berniece Salines, DO     Other Instructions   Important Information About Sugar

## 2022-10-16 ENCOUNTER — Other Ambulatory Visit: Payer: Self-pay

## 2022-10-16 MED ORDER — PROPRANOLOL HCL 40 MG PO TABS: 40.0000 mg | ORAL_TABLET | Freq: Every day | ORAL | 1 refills | Status: DC

## 2022-12-17 ENCOUNTER — Ambulatory Visit: Payer: BC Managed Care – PPO | Attending: Cardiology | Admitting: Cardiology

## 2022-12-17 ENCOUNTER — Encounter: Payer: Self-pay | Admitting: Cardiology

## 2022-12-17 VITALS — BP 124/80 | HR 76 | Ht 59.0 in | Wt 205.0 lb

## 2022-12-17 DIAGNOSIS — E669 Obesity, unspecified: Secondary | ICD-10-CM

## 2022-12-17 DIAGNOSIS — I491 Atrial premature depolarization: Secondary | ICD-10-CM | POA: Diagnosis not present

## 2022-12-17 DIAGNOSIS — I4719 Other supraventricular tachycardia: Secondary | ICD-10-CM

## 2022-12-17 DIAGNOSIS — E8881 Metabolic syndrome: Secondary | ICD-10-CM

## 2022-12-17 DIAGNOSIS — Z79899 Other long term (current) drug therapy: Secondary | ICD-10-CM

## 2022-12-17 NOTE — Patient Instructions (Signed)
Medication Instructions:  Your physician recommends that you continue on your current medications as directed. Please refer to the Current Medication list given to you today.  *If you need a refill on your cardiac medications before your next appointment, please call your pharmacy*   Lab Work: Your physician recommends that you have the following lab drawn today: Flecainide level  If you have labs (blood work) drawn today and your tests are completely normal, you will receive your results only by: MyChart Message (if you have MyChart) OR A paper copy in the mail If you have any lab test that is abnormal or we need to change your treatment, we will call you to review the results.   Testing/Procedures: NONE   Follow-Up: At [facility], you and your health needs are our priority.  As part of our continuing mission to provide you with exceptional heart care, we have created designated Provider Care Teams.  These Care Teams include your primary Cardiologist (physician) and Advanced Practice Providers (APPs -  Physician Assistants and Nurse Practitioners) who all work together to provide you with the care you need, when you need it.  We recommend signing up for the patient portal called "MyChart".  Sign up information is provided on this After Visit Summary.  MyChart is used to connect with patients for Virtual Visits (Telemedicine).  Patients are able to view lab/test results, encounter notes, upcoming appointments, etc.  Non-urgent messages can be sent to your provider as well.   To learn more about what you can do with MyChart, go to [email or web address].    Your next appointment:   1 year(s)  The format for your next appointment:   In Person  Provider:   Kardie Tobb, DO   
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
## 2023-01-01 LAB — FLECAINIDE LEVEL: Flecainide: <0.09 ug/ml — ABNORMAL LOW (ref 0.20–1.00)

## 2023-05-07 ENCOUNTER — Encounter: Payer: Self-pay | Admitting: Cardiology

## 2023-05-07 ENCOUNTER — Other Ambulatory Visit: Payer: Self-pay

## 2023-05-07 DIAGNOSIS — I491 Atrial premature depolarization: Secondary | ICD-10-CM

## 2023-05-07 MED ORDER — PROPRANOLOL HCL 40 MG PO TABS
40.0000 mg | ORAL_TABLET | Freq: Every day | ORAL | 1 refills | Status: DC
Start: 2023-05-07 — End: 2023-10-24

## 2023-05-08 ENCOUNTER — Other Ambulatory Visit: Payer: Self-pay | Admitting: Family Medicine

## 2023-05-08 DIAGNOSIS — Z1231 Encounter for screening mammogram for malignant neoplasm of breast: Secondary | ICD-10-CM

## 2023-05-14 ENCOUNTER — Ambulatory Visit
Admission: RE | Admit: 2023-05-14 | Discharge: 2023-05-14 | Disposition: A | Discharge status: Home / Self Care | Payer: BC Managed Care – PPO | Source: Ambulatory Visit | Attending: Family Medicine | Admitting: Family Medicine

## 2023-05-14 DIAGNOSIS — Z1231 Encounter for screening mammogram for malignant neoplasm of breast: Secondary | ICD-10-CM

## 2023-09-25 ENCOUNTER — Other Ambulatory Visit: Payer: Self-pay

## 2023-09-25 MED ORDER — FLECAINIDE ACETATE 50 MG PO TABS: 50.0000 mg | ORAL_TABLET | Freq: Two times a day (BID) | ORAL | 0 refills | Status: DC

## 2023-10-24 ENCOUNTER — Other Ambulatory Visit: Payer: Self-pay

## 2023-10-24 DIAGNOSIS — I491 Atrial premature depolarization: Secondary | ICD-10-CM

## 2023-10-24 MED ORDER — PROPRANOLOL HCL 40 MG PO TABS
40.0000 mg | ORAL_TABLET | Freq: Every day | ORAL | 0 refills | Status: DC
Start: 2023-10-24 — End: 2024-02-05

## 2023-12-18 ENCOUNTER — Ambulatory Visit: Payer: BC Managed Care – PPO | Attending: Cardiology | Admitting: Cardiology

## 2023-12-18 ENCOUNTER — Encounter: Payer: Self-pay | Admitting: Cardiology

## 2023-12-18 VITALS — BP 120/70 | HR 91 | Ht 59.0 in | Wt 224.2 lb

## 2023-12-18 DIAGNOSIS — E669 Obesity, unspecified: Secondary | ICD-10-CM

## 2023-12-18 DIAGNOSIS — Z79899 Other long term (current) drug therapy: Secondary | ICD-10-CM

## 2023-12-18 DIAGNOSIS — E8881 Metabolic syndrome: Secondary | ICD-10-CM

## 2023-12-18 DIAGNOSIS — D8689 Sarcoidosis of other sites: Secondary | ICD-10-CM

## 2023-12-18 DIAGNOSIS — I4719 Other supraventricular tachycardia: Secondary | ICD-10-CM

## 2023-12-18 NOTE — Patient Instructions (Signed)
Medication Instructions:  Your physician recommends that you continue on your current medications as directed. Please refer to the Current Medication list given to you today.  *If you need a refill on your cardiac medications before your next appointment, please call your pharmacy*   Lab Work: Your physician recommends that you labs drawn today: Flecainide level   If you have labs (blood work) drawn today and your tests are completely normal, you will receive your results only by: MyChart Message (if you have MyChart) OR A paper copy in the mail If you have any lab test that is abnormal or we need to change your treatment, we will call you to review the results.   Testing/Procedures: See below   Follow-Up: At [facility], you and your health needs are our priority.  As part of our continuing mission to provide you with exceptional heart care, we have created designated Provider Care Teams.  These Care Teams include your primary Cardiologist (physician) and Advanced Practice Providers (APPs -  Physician Assistants and Nurse Practitioners) who all work together to provide you with the care you need, when you need it.  We recommend signing up for the patient portal called "MyChart".  Sign up information is provided on this After Visit Summary.  MyChart is used to connect with patients for Virtual Visits (Telemedicine).  Patients are able to view lab/test results, encounter notes, upcoming appointments, etc.  Non-urgent messages can be sent to your provider as well.   To learn more about what you can do with MyChart, go to [email or web address].    Your next appointment:   12 month(s)  Provider:   Thomasene Ripple, DO     Other Instructions   You are scheduled for Cardiac MRI at the location below.  Please arrive for your appointment at ______________ . ?  [facility] [address] [place], [place] [address] [phone] Please take advantage of the free  valet parking available at the [facility] and [employer] (Entrance C).  Proceed to the [facility] Radiology Department (First Floor) for check-in.    Magnetic resonance imaging (MRI) is a painless test that produces images of the inside of the body without using Xrays.  During an MRI, strong magnets and radio waves work together in a Data processing manager to form detailed images.   MRI images may provide more details about a medical condition than X-rays, CT scans, and ultrasounds can provide.  You may be given earphones to listen for instructions.  You may eat a light breakfast and take medications as ordered with the exception of furosemide, hydrochlorothiazide, chlorthalidone or spironolactone (or any other fluid pill). If you are undergoing a stress MRI, please avoid stimulants for 12 hr prior to test. (I.e. Caffeine, nicotine, chocolate, or antihistamine medications)  An IV will be inserted into one of your veins. Contrast material will be injected into your IV. It will leave your body through your urine within a day. You may be told to drink plenty of fluids to help flush the contrast material out of your system.  You will be asked to remove all metal, including: Watch, jewelry, and other metal objects including hearing aids, hair pieces and dentures. Also wearable glucose monitoring systems (ie. Freestyle Libre and Omnipods) (Braces and fillings normally are not a problem.)   TEST WILL TAKE APPROXIMATELY 1 HOUR  PLEASE NOTIFY SCHEDULING AT LEAST 24 HOURS IN ADVANCE IF YOU ARE UNABLE TO KEEP YOUR APPOINTMENT. [phone]  For more information and  frequently asked questions, please visit our website : [email or web address]  Please call the Cardiac Imaging Nurse Navigators with any questions/concerns. [phone] Office
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
## 2023-12-24 LAB — FLECAINIDE LEVEL: Flecainide: 0.11 ug/mL — ABNORMAL LOW (ref 0.20–1.00)

## 2024-01-14 ENCOUNTER — Other Ambulatory Visit: Payer: Self-pay

## 2024-01-14 MED ORDER — FLECAINIDE ACETATE 50 MG PO TABS: 50.0000 mg | ORAL_TABLET | Freq: Two times a day (BID) | ORAL | 3 refills | Status: DC

## 2024-01-15 ENCOUNTER — Encounter: Payer: Self-pay | Admitting: Cardiology

## 2024-01-15 MED ORDER — LORAZEPAM 1 MG PO TABS: 1.0000 mg | ORAL_TABLET | Freq: Once | ORAL | 0 refills | Status: DC

## 2024-01-15 MED ORDER — LORAZEPAM 1 MG PO TABS: 1.0000 mg | ORAL_TABLET | Freq: Once | ORAL | 0 refills | Status: AC

## 2024-01-15 NOTE — Telephone Encounter (Signed)
Thomasene Ripple, DO     01/15/24 10:24 AM Please send Ativan 1 mg x1

## 2024-01-15 NOTE — Telephone Encounter (Signed)
Prescription for Lorazepam 1mg , 1 tablet called in to [facility]  Instructions to take tablet 1 hour before MRI, have someone drive to and from MRI

## 2024-02-05 ENCOUNTER — Other Ambulatory Visit: Payer: Self-pay

## 2024-02-05 DIAGNOSIS — I491 Atrial premature depolarization: Secondary | ICD-10-CM

## 2024-02-05 MED ORDER — PROPRANOLOL HCL 40 MG PO TABS: 40.0000 mg | ORAL_TABLET | Freq: Every day | ORAL | 3 refills | Status: DC

## 2024-02-24 ENCOUNTER — Encounter (HOSPITAL_COMMUNITY): Payer: Self-pay

## 2024-02-26 ENCOUNTER — Other Ambulatory Visit: Payer: Self-pay | Admitting: Cardiology

## 2024-02-26 ENCOUNTER — Ambulatory Visit (HOSPITAL_COMMUNITY)
Admission: RE | Admit: 2024-02-26 | Discharge: 2024-02-26 | Disposition: A | Discharge status: Home / Self Care | Payer: 59 | Source: Ambulatory Visit | Attending: Cardiology | Admitting: Cardiology

## 2024-02-26 DIAGNOSIS — E8881 Metabolic syndrome: Secondary | ICD-10-CM | POA: Insufficient documentation

## 2024-02-26 DIAGNOSIS — E669 Obesity, unspecified: Secondary | ICD-10-CM

## 2024-02-26 DIAGNOSIS — Z79899 Other long term (current) drug therapy: Secondary | ICD-10-CM

## 2024-02-26 DIAGNOSIS — I4719 Other supraventricular tachycardia: Secondary | ICD-10-CM | POA: Insufficient documentation

## 2024-02-26 MED ORDER — GADOBUTROL 1 MMOL/ML IV SOLN
10.0000 mL | Freq: Once | INTRAVENOUS | Status: AC | PRN
  Administered 2024-02-26: 10 mL via INTRAVENOUS

## 2024-02-27 ENCOUNTER — Encounter: Payer: Self-pay | Admitting: Cardiology

## 2024-11-16 ENCOUNTER — Encounter: Payer: Self-pay | Admitting: Cardiology

## 2025-01-10 ENCOUNTER — Other Ambulatory Visit: Payer: Self-pay

## 2025-01-11 MED ORDER — FLECAINIDE ACETATE 50 MG PO TABS: 50.0000 mg | ORAL_TABLET | Freq: Two times a day (BID) | ORAL | 0 refills | Status: AC
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
## 2025-01-20 NOTE — Progress Notes (Signed)
 Cristina Mora is a 54 y.o. y/o female, who presents to neuro-ophthalmology clinic on November 18, 2024 due to sarcoidosis follow up.    Patient Care Team: Trinidad Hun, MD as PCP - General   Ophthalmic Tech preparing this note: Twanda   REASON FOR VISIT (Chief Complaint): 54 yo female here today for follow up optic atrophy.  Patient reports the night time driving has gotten worse due to the blinding lights from other cars.  11/20/2=54 y/o female returns for follow up diplopia.  Patient states her vision is always blurry.  She still has double vision when looking up.  She started Remicade 11/08/2024. On Prednisone 40mg  /day; methotrexate 25mg /week; and infliximab - MRI planned for 01/14/2025  10/12/2024:States that vision is still a blurred smear and has diplopia when both eye are open looking up. Last headache was yesterday that was on and off throughout the day. No eye pain or discomfort.  Worsening color vision. Visual field today was unreliable not clear if worse; will repeat in 22month   Interval update 04/08/24: Blurry vision and diplopia has been worsening. Diplopia is worse at distance and is happening all the time. Images are up and down and describes as a blur/smear up. Does not happen at near. Still occurs when closing one eye, left eye is worse. Diplopia does not go away when one eye is closed. Also has severe sensitivity to bright light. Eyes also feel very dry. Is now on 40 mg Methotrexate ad 35 mg pred (on a taper per Dr Dennise). Using OTC lubricating drops BID. Also endorses a pressure like pain in the right eye that lasts minutes to hours. Feels vision has gotten worse since last time she was here.    Blurry vision ou, onset early 11/2023. Denies pain on eye movement.   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 -2014 left pupil stopped constricting. - 12/2023: CN III palsy and myelopathic symptoms. 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 Started on 60mg  prednisone daily - 02/2024: startedon methotrexate 25mg  once a week. - 03/2024: xt with diplopia in lateral gaze. - 09/03/2024: seen by neurology: maintained on 30mg  prednisone and 25mg  methotrexate. - 10/12/24: stable RNFL/GCL. Worsening color plates compared to 03/2024. VA stable, given new glasses  - 11/18/24: color plates improved form last visit. Stable RNFL/GCL/VA. VF today worse but unreliable with high false negatives/high fixation losses. Overall stable exam.   PMHx: - She  has a past medical history of ADHD, Anisocoria (2014), Arthritis, Blindness (2021), Dermatographism, Developmental dyslexia, Dry eyes, Growth hormone deficiency (CMS-HCC), Hashimoto's disease, Headache, Hypothyroid, Optic neuritis (2009), Sarcoidosis (2025), Strabismus (2009), SVT (supraventricular tachycardia) (CMS-HCC), TIA (transient ischemic attack), and Vitamin D deficiency. - She has Myelitis (CMS-HCC) on their problem list.   Surgical Hx: - She  has a past surgical history that includes left knee arthroscopic surgery (Left); Bariatric Surgery; and pr colonoscopy flx dx w/collj spec when pfrmd (Left, 10/14/2023).   Medication List: - She has a current medication list which includes the following prescription(s): amitriptyline, vitamin d3-vitamin k2, cyanocobalamin (vitamin b-12), fexofenadine, flecainide , gabapentin, levonorgestrel, levothyroxine, naltrexone, np thyroid, prasterone (dhea), propranolol , rosuvastatin, semaglutide, venlafaxine, pantoprazole, prednisone, and sulfamethoxazole-trimethoprim.   Allergies: - She is allergic to amoxicillin, other, penicillins, pollens extract, grass pollen-june grass standard,  and nickel.   Social Hx: - She  reports that she quit smoking about 17 years ago. Her smoking  use included cigarettes. She has a 5 pack-year smoking history. She has never been exposed to tobacco smoke. She has never used smokeless tobacco. She reports current alcohol use of about 1.0 standard drink of alcohol per week. She reports that she does not currently use drugs after having used the following drugs: Marijuana.   Family Hx/Pedigree/Heredogram: - She family history includes Cancer in her father and mother.   General Imaging: MRI Brain W WO 07/23/24 No evidence of neurosarcoidosis. Prominent vessel in the right IAC.   MRI brain w wo 01/14/25 - no active inflammation   EXAM TODAY  - BCVA: Corrected distance visual acuity was 20/60 in the right eye and 20/70 +2 in the left eye. Corrected near visual acuity was J5 in the right eye and J5 in the left eye. Base Eye Exam     Visual Acuity (Snellen - Linear)       Right Left   Dist cc 20/60 20/70 +2   Dist ph cc 20/50 20/60   Near cc J5 J5    Correction: Glasses         Tonometry (Icare, 1:23 PM)       Right Left   Pressure 20 19         Pupils       Dark Shape React APD   Right 4 Round Fixed None   Left 6 Round Fixed None         Visual Fields       Left Right    Full Full         Extraocular Movement       Right Left    Full Full         Neuro/Psych     Oriented x3: Yes   Mood/Affect: Normal           Additional Tests     Color       Right Left   Ishihara 7/11 4/11           Slit Lamp and Fundus Exam     External Exam       Right Left   External Normal Normal         Slit Lamp Exam       Right Left   Lids/Lashes Ptosis Ptosis, likely chalazion medial LUL   Conjunctiva/Sclera White and quiet White and quiet   Cornea RTBUT, mucoid tear film debris, inferior PEE RTBUT, mucoid tear film debris, diffuse PEE   Anterior Chamber Deep and quiet Deep and quiet   Iris mildly reactive poorly reactive   Lens Clear Clear   Anterior Vitreous Normal Normal          Fundus Exam       Right Left   Disc PPA; small full disc PPA; small full disc   Macula Normal Normal           Refraction     Wearing Rx       Sphere Cylinder Axis Add   Right -3.25 +2.25 165 +2.25   Left -2.75 +2.50 175 +2.25    Type: pal         Manifest Refraction       Sphere Cylinder Axis Dist VA Add Near [place]   Right -3.75 +2.25 165 20/50 +2.25 J2   Left -2.75 +2.50 175 20/60+2 +2.25 J2  ANCILLARY TESTING - Humphrey Visual Field (HVF): unreliable OU - OCT Retinal Nerve Fiber Layer (RNFL): 69//69  stable OU - OCT macular Ganglion Cell Complex (GCC): 67//69 stable OU  ASSESSMENT  # Neurosarcoidosis - bilateral diplopia(resolved) - decreased color vision both eyes compared to 03/2024. - 10/12/24: stable RNFL/GCL. Worsening color plates compared to 03/2024. VA stable, given new glasses RX. Noted progressive visual loss in both eyes since previous visiti in 03/2024. - 11/18/24: color plates improved form last visit. Stable RNFL/GCL/VA. VF today worse but unreliable with high false negatives/high fixation losses. Overall stable exam. - 01/20/25 stable exam    # Bilateral monocular vertical diplopia. - image spreads upwards in either eye.  Left eye worse than right eye - Dry eye vs refractive.   # Adies tonic Pupil both eyes - sectoral contraction of the pupil on either side; - left eye> right eye ; pupil in left eye barely responsive to light - small light near dissociation seen in left eye   # Amaurosis fugax; - single episode of transient visual loss both eyes lasting 1 minute(while driving) in 7978 - had stroke workup which was negative   # Binocular horizontal diplopia (resolved) - On right gaze; no double in primary - has an XT she is able to compensate well on primary gaze - has a left hypotropia; but has not noticed vertical diplopia since 2009   # Small discs;  - Thinned RNFL/GCL both eyes; concentric thinning could be partially  explained by myopia as well. - Normal VA, color plates  - ddx includes neurosarcoidosis  PLAN  # keep Neurology visit as planned  # Patient informed she does not meet visual criteria to drive in [place] .  # Return in about 2 months (around 03/20/2025) for HVF/RNFL/GCL.  Patient has been advised regarding signs and symptoms suggestive of worsening or new problem, to report to the [facility]  Emergency Room immediately.  Advised if there are further questions they can contact us  via [email or web address] or calling our clinical office number [phone], Environmental Health Practitioner).     Cristina KANDICE Burns, MD      Dear Samuel Stank  I greatly appreciate the referral.   Please do not hesitate to contact me if you have any questions or concerns or additional information regarding our mutual patient.    Best regards, Cristina Burns, MD  Neuro-ophthalmologist    PREPARING FOR YOUR NEXT NEURO-OPHTHALMOLOGY EVALUATION Please be advised that Neuro-Ophthalmology evaluation is a highly specialized and thorough process. The time requirement for visits ranges from 3-5 hours. This includes registration, technician evaluation, pupils dilation, special testing, attending/fellow chart review and clinical encounter with attending provider. 1. Request that your treating physicians send all relevant information to the neuro- ophthalmologist prior to your appointment, including office notes, results of laboratory tests and reports of CT and MRI scans. 2. If you have had a CT or MRI scan performed, arrange to pick up the actual films and bring them with you, or have the facility mail them to the neuro-ophthalmologist in advance or your appointment. 3. You will probably have your pupils dilated during the visit. The eye drops last about 4 hours and will make things look bright and blurry up close. Have someone else drive you to the appointment and bring your sunglasses. 4.  Ladies, in order for the physician to get a good look at your eyelids and to avoid ruining your appearance when the eye drops are administered, do not wear eye makeup.  5. Bring a complete list of medications with you, including the name and dosage of prescription and over-the-counter medications.  WHAT WILL HAPPEN DURING YOUR NEXT EVALUATION? 1. The neuro-ophthalmologic evaluation is one of the most comprehensive examinations you will experience. It may take a few hours to complete. You will be asked to give an account of your current problem and relate your entire medical history, including previous hospitalizations, operations, serious illnesses, medical problems in your family members, and medication allergies.  2. You will have a complete eye examination. This may include testing of your peripheral vision (visual field test). 3. You may have a partial or complete neurologic exam to test your strength, sensation, and coordination.  4. The neuro-ophthalmologist will review the records and scans from previous evaluations, if applicable.  5. After the examination, the neuro-ophthalmolgist will discuss the diagnosis (or possible diagnoses), the need for any additional testing and possible treatment.

## 2025-01-21 ENCOUNTER — Ambulatory Visit: Attending: Cardiology | Admitting: Cardiology

## 2025-01-21 ENCOUNTER — Encounter: Payer: Self-pay | Admitting: Cardiology

## 2025-01-21 VITALS — BP 100/58 | HR 85 | Ht 59.0 in | Wt 236.0 lb

## 2025-01-21 DIAGNOSIS — I4719 Other supraventricular tachycardia: Secondary | ICD-10-CM

## 2025-01-21 DIAGNOSIS — D8689 Sarcoidosis of other sites: Secondary | ICD-10-CM | POA: Diagnosis not present

## 2025-01-21 DIAGNOSIS — Z79899 Other long term (current) drug therapy: Secondary | ICD-10-CM

## 2025-01-21 DIAGNOSIS — I491 Atrial premature depolarization: Secondary | ICD-10-CM | POA: Diagnosis not present

## 2025-01-21 NOTE — Patient Instructions (Signed)
 Medication Instructions:  Your physician recommends that you continue on your current medications as directed. Please refer to the Current Medication list given to you today.  *If you need a refill on your cardiac medications before your next appointment, please call your pharmacy*  Lab Work: In the Next 2 weeks: BMET, Flecainide  Level  If you have labs (blood work) drawn today and your tests are completely normal, you will receive your results only by: MyChart Message (if you have MyChart) OR A paper copy in the mail If you have any lab test that is abnormal or we need to change your treatment, we will call you to review the results.  Testing/Procedures: NONE  Follow-Up: At [facility] you and your health needs are our priority.  As part of our continuing mission to provide you with exceptional heart care, our providers are all part of one team.  This team includes your primary Cardiologist (physician) and Advanced Practice Providers or APPs (Physician Assistants and Nurse Practitioners) who all work together to provide you with the care you need, when you need it.  Your next appointment:   1 year(s)  Provider:   Kardie Tobb, DO

## 2025-01-27 ENCOUNTER — Other Ambulatory Visit: Payer: Self-pay

## 2025-01-27 DIAGNOSIS — I491 Atrial premature depolarization: Secondary | ICD-10-CM

## 2025-01-27 MED ORDER — PROPRANOLOL HCL 40 MG PO TABS: 40.0000 mg | ORAL_TABLET | Freq: Every day | ORAL | 3 refills | Status: AC
